# Patient Record
Sex: Female | Born: 1945 | Race: Black or African American | Hispanic: No | Marital: Single | Smoking: Never smoker
Health system: Southern US, Community
[De-identification: ages and names within clinical notes are randomized; demographics above are authoritative.]

## PROBLEM LIST (undated history)

## (undated) DIAGNOSIS — K769 Liver disease, unspecified: Secondary | ICD-10-CM

## (undated) DIAGNOSIS — R413 Other amnesia: Secondary | ICD-10-CM

## (undated) DIAGNOSIS — E041 Nontoxic single thyroid nodule: Secondary | ICD-10-CM

## (undated) DIAGNOSIS — K589 Irritable bowel syndrome without diarrhea: Secondary | ICD-10-CM

## (undated) DIAGNOSIS — I10 Essential (primary) hypertension: Secondary | ICD-10-CM

## (undated) DIAGNOSIS — G912 (Idiopathic) normal pressure hydrocephalus: Secondary | ICD-10-CM

## (undated) HISTORY — DX: Irritable bowel syndrome, unspecified: K58.9

## (undated) HISTORY — PX: PARTIAL HYSTERECTOMY: SHX80

## (undated) HISTORY — PX: OTHER SURGICAL HISTORY: SHX169

## (undated) HISTORY — DX: Liver disease, unspecified: K76.9

## (undated) HISTORY — DX: Other amnesia: R41.3

---

## 1975-08-22 DIAGNOSIS — D493 Neoplasm of unspecified behavior of breast: Secondary | ICD-10-CM | POA: Insufficient documentation

## 1998-05-31 ENCOUNTER — Ambulatory Visit (HOSPITAL_COMMUNITY): Admission: RE | Admit: 1998-05-31 | Discharge: 1998-05-31 | Payer: Self-pay | Admitting: *Deleted

## 1998-05-31 ENCOUNTER — Encounter: Payer: Self-pay | Admitting: *Deleted

## 2001-07-31 ENCOUNTER — Emergency Department (HOSPITAL_COMMUNITY): Admission: EM | Admit: 2001-07-31 | Discharge: 2001-07-31 | Payer: Self-pay

## 2001-08-28 ENCOUNTER — Ambulatory Visit (HOSPITAL_COMMUNITY): Admission: RE | Admit: 2001-08-28 | Discharge: 2001-08-28 | Payer: Self-pay | Admitting: Family Medicine

## 2003-07-01 ENCOUNTER — Encounter: Admission: RE | Admit: 2003-07-01 | Discharge: 2003-07-01 | Payer: Self-pay | Admitting: Emergency Medicine

## 2004-07-20 ENCOUNTER — Ambulatory Visit (HOSPITAL_COMMUNITY): Admission: RE | Admit: 2004-07-20 | Discharge: 2004-07-20 | Payer: Self-pay | Admitting: Emergency Medicine

## 2005-09-14 ENCOUNTER — Ambulatory Visit (HOSPITAL_COMMUNITY): Admission: RE | Admit: 2005-09-14 | Discharge: 2005-09-14 | Payer: Self-pay | Admitting: Emergency Medicine

## 2006-09-20 ENCOUNTER — Ambulatory Visit (HOSPITAL_COMMUNITY): Admission: RE | Admit: 2006-09-20 | Discharge: 2006-09-20 | Payer: Self-pay | Admitting: Emergency Medicine

## 2006-12-10 ENCOUNTER — Ambulatory Visit: Payer: Self-pay | Admitting: Internal Medicine

## 2006-12-10 ENCOUNTER — Encounter (INDEPENDENT_AMBULATORY_CARE_PROVIDER_SITE_OTHER): Payer: Self-pay | Admitting: Internal Medicine

## 2006-12-10 DIAGNOSIS — E7800 Pure hypercholesterolemia, unspecified: Secondary | ICD-10-CM

## 2006-12-10 LAB — CONVERTED CEMR LAB
Cholesterol: 200 mg/dL
Creatinine, Ser: 0.86 mg/dL
Platelets: 418 10*3/uL
Triglycerides: 136 mg/dL

## 2007-03-13 ENCOUNTER — Encounter (INDEPENDENT_AMBULATORY_CARE_PROVIDER_SITE_OTHER): Payer: Self-pay | Admitting: Internal Medicine

## 2007-03-13 DIAGNOSIS — K5730 Diverticulosis of large intestine without perforation or abscess without bleeding: Secondary | ICD-10-CM | POA: Insufficient documentation

## 2007-03-13 DIAGNOSIS — K648 Other hemorrhoids: Secondary | ICD-10-CM | POA: Insufficient documentation

## 2007-03-26 ENCOUNTER — Encounter (INDEPENDENT_AMBULATORY_CARE_PROVIDER_SITE_OTHER): Payer: Self-pay | Admitting: Internal Medicine

## 2007-03-26 DIAGNOSIS — I10 Essential (primary) hypertension: Secondary | ICD-10-CM

## 2007-03-26 DIAGNOSIS — R002 Palpitations: Secondary | ICD-10-CM | POA: Insufficient documentation

## 2007-03-26 DIAGNOSIS — F411 Generalized anxiety disorder: Secondary | ICD-10-CM | POA: Insufficient documentation

## 2007-08-27 ENCOUNTER — Telehealth (INDEPENDENT_AMBULATORY_CARE_PROVIDER_SITE_OTHER): Payer: Self-pay | Admitting: *Deleted

## 2007-08-27 ENCOUNTER — Encounter: Payer: Self-pay | Admitting: Internal Medicine

## 2007-10-31 ENCOUNTER — Ambulatory Visit (HOSPITAL_COMMUNITY): Admission: RE | Admit: 2007-10-31 | Discharge: 2007-10-31 | Payer: Self-pay | Admitting: Family Medicine

## 2007-11-07 ENCOUNTER — Encounter (INDEPENDENT_AMBULATORY_CARE_PROVIDER_SITE_OTHER): Payer: Self-pay | Admitting: Internal Medicine

## 2008-01-28 ENCOUNTER — Ambulatory Visit: Payer: Self-pay | Admitting: Internal Medicine

## 2008-01-28 DIAGNOSIS — H6120 Impacted cerumen, unspecified ear: Secondary | ICD-10-CM

## 2008-03-30 ENCOUNTER — Ambulatory Visit: Payer: Self-pay | Admitting: Family Medicine

## 2008-04-13 ENCOUNTER — Telehealth (INDEPENDENT_AMBULATORY_CARE_PROVIDER_SITE_OTHER): Payer: Self-pay | Admitting: Internal Medicine

## 2008-04-13 LAB — CONVERTED CEMR LAB
ALT: 17 units/L (ref 0–35)
Albumin: 4.6 g/dL (ref 3.5–5.2)
CO2: 24 meq/L (ref 19–32)
Calcium: 10 mg/dL (ref 8.4–10.5)
Chloride: 107 meq/L (ref 96–112)
Cholesterol: 221 mg/dL — ABNORMAL HIGH (ref 0–200)
Eosinophils Relative: 3 % (ref 0–5)
Glucose, Bld: 134 mg/dL — ABNORMAL HIGH (ref 70–99)
HCT: 40.5 % (ref 36.0–46.0)
Hemoglobin: 13 g/dL (ref 12.0–15.0)
Lymphocytes Relative: 40 % (ref 12–46)
Lymphs Abs: 2.9 10*3/uL (ref 0.7–4.0)
Monocytes Absolute: 0.4 10*3/uL (ref 0.1–1.0)
Neutro Abs: 3.8 10*3/uL (ref 1.7–7.7)
Platelets: 342 10*3/uL (ref 150–400)
Sodium: 144 meq/L (ref 135–145)
Total Bilirubin: 0.3 mg/dL (ref 0.3–1.2)
Total Protein: 7.4 g/dL (ref 6.0–8.3)
Triglycerides: 126 mg/dL (ref <150)
VLDL: 25 mg/dL (ref 0–40)
WBC: 7.3 10*3/uL (ref 4.0–10.5)

## 2008-05-05 ENCOUNTER — Encounter (INDEPENDENT_AMBULATORY_CARE_PROVIDER_SITE_OTHER): Payer: Self-pay | Admitting: *Deleted

## 2008-06-04 DIAGNOSIS — H269 Unspecified cataract: Secondary | ICD-10-CM | POA: Insufficient documentation

## 2008-06-08 ENCOUNTER — Encounter (INDEPENDENT_AMBULATORY_CARE_PROVIDER_SITE_OTHER): Payer: Self-pay | Admitting: Internal Medicine

## 2008-11-27 ENCOUNTER — Encounter (INDEPENDENT_AMBULATORY_CARE_PROVIDER_SITE_OTHER): Payer: Self-pay | Admitting: Internal Medicine

## 2008-11-27 ENCOUNTER — Ambulatory Visit (HOSPITAL_COMMUNITY): Admission: RE | Admit: 2008-11-27 | Discharge: 2008-11-27 | Payer: Self-pay | Admitting: Family Medicine

## 2008-11-27 ENCOUNTER — Encounter (INDEPENDENT_AMBULATORY_CARE_PROVIDER_SITE_OTHER): Payer: Self-pay | Admitting: Nurse Practitioner

## 2009-01-13 ENCOUNTER — Encounter (INDEPENDENT_AMBULATORY_CARE_PROVIDER_SITE_OTHER): Payer: Self-pay | Admitting: Internal Medicine

## 2009-11-02 ENCOUNTER — Telehealth (INDEPENDENT_AMBULATORY_CARE_PROVIDER_SITE_OTHER): Payer: Self-pay | Admitting: Internal Medicine

## 2009-11-18 ENCOUNTER — Ambulatory Visit: Payer: Self-pay | Admitting: Internal Medicine

## 2009-11-18 LAB — CONVERTED CEMR LAB
Albumin: 4.4 g/dL (ref 3.5–5.2)
Alkaline Phosphatase: 77 units/L (ref 39–117)
BUN: 13 mg/dL (ref 6–23)
Basophils Absolute: 0 10*3/uL (ref 0.0–0.1)
Basophils Relative: 0 % (ref 0–1)
Bilirubin Urine: NEGATIVE
Blood in Urine, dipstick: NEGATIVE
CO2: 27 meq/L (ref 19–32)
Cholesterol: 220 mg/dL — ABNORMAL HIGH (ref 0–200)
Eosinophils Relative: 3 % (ref 0–5)
Glucose, Bld: 81 mg/dL (ref 70–99)
Glucose, Urine, Semiquant: NEGATIVE
HCT: 42.5 % (ref 36.0–46.0)
HDL: 55 mg/dL (ref >39)
Hemoglobin: 12.9 g/dL (ref 12.0–15.0)
Ketones, urine, test strip: NEGATIVE
MCHC: 30.4 g/dL (ref 30.0–36.0)
MCV: 81.9 fL (ref 78.0–100.0)
Monocytes Absolute: 0.4 10*3/uL (ref 0.1–1.0)
Monocytes Relative: 5 % (ref 3–12)
Neutro Abs: 3.7 10*3/uL (ref 1.7–7.7)
RBC: 5.19 M/uL — ABNORMAL HIGH (ref 3.87–5.11)
RDW: 14.4 % (ref 11.5–15.5)
Sodium: 144 meq/L (ref 135–145)
Total Bilirubin: 0.4 mg/dL (ref 0.3–1.2)
Total Protein: 7.2 g/dL (ref 6.0–8.3)
Triglycerides: 124 mg/dL (ref <150)
Urobilinogen, UA: 0.2
VLDL: 25 mg/dL (ref 0–40)

## 2009-11-29 ENCOUNTER — Ambulatory Visit (HOSPITAL_COMMUNITY): Admission: RE | Admit: 2009-11-29 | Discharge: 2009-11-29 | Payer: Self-pay | Admitting: Emergency Medicine

## 2009-11-30 ENCOUNTER — Encounter (INDEPENDENT_AMBULATORY_CARE_PROVIDER_SITE_OTHER): Payer: Self-pay | Admitting: Internal Medicine

## 2010-04-27 ENCOUNTER — Ambulatory Visit: Payer: Self-pay | Admitting: Internal Medicine

## 2010-04-27 DIAGNOSIS — F068 Other specified mental disorders due to known physiological condition: Secondary | ICD-10-CM

## 2010-04-27 LAB — CONVERTED CEMR LAB
ALT: 15 units/L (ref 0–35)
AST: 17 units/L (ref 0–37)
Alkaline Phosphatase: 81 units/L (ref 39–117)
Basophils Absolute: 0 10*3/uL (ref 0.0–0.1)
Basophils Relative: 1 % (ref 0–1)
Creatinine, Ser: 0.76 mg/dL (ref 0.40–1.20)
Eosinophils Relative: 2 % (ref 0–5)
HCT: 43.1 % (ref 36.0–46.0)
Hemoglobin: 13.2 g/dL (ref 12.0–15.0)
MCHC: 30.6 g/dL (ref 30.0–36.0)
Monocytes Absolute: 0.4 10*3/uL (ref 0.1–1.0)
Platelets: 350 10*3/uL (ref 150–400)
RDW: 14.2 % (ref 11.5–15.5)
Sodium: 142 meq/L (ref 135–145)
TSH: 1.076 microintl units/mL (ref 0.350–4.500)
Total Bilirubin: 0.3 mg/dL (ref 0.3–1.2)

## 2010-04-29 ENCOUNTER — Encounter (INDEPENDENT_AMBULATORY_CARE_PROVIDER_SITE_OTHER): Payer: Self-pay | Admitting: Internal Medicine

## 2010-05-09 ENCOUNTER — Telehealth (INDEPENDENT_AMBULATORY_CARE_PROVIDER_SITE_OTHER): Payer: Self-pay | Admitting: Internal Medicine

## 2010-05-11 ENCOUNTER — Ambulatory Visit: Payer: Self-pay | Admitting: Internal Medicine

## 2010-05-11 LAB — CONVERTED CEMR LAB
BUN: 16 mg/dL (ref 6–23)
CO2: 26 meq/L (ref 19–32)
Chloride: 102 meq/L (ref 96–112)
Glucose, Bld: 99 mg/dL (ref 70–99)
Potassium: 4 meq/L (ref 3.5–5.3)

## 2010-05-13 ENCOUNTER — Encounter (INDEPENDENT_AMBULATORY_CARE_PROVIDER_SITE_OTHER): Payer: Self-pay | Admitting: Internal Medicine

## 2010-06-28 ENCOUNTER — Ambulatory Visit: Payer: Self-pay | Admitting: Internal Medicine

## 2010-06-28 DIAGNOSIS — R109 Unspecified abdominal pain: Secondary | ICD-10-CM | POA: Insufficient documentation

## 2010-07-04 ENCOUNTER — Telehealth (INDEPENDENT_AMBULATORY_CARE_PROVIDER_SITE_OTHER): Payer: Self-pay | Admitting: Internal Medicine

## 2010-07-04 ENCOUNTER — Encounter (INDEPENDENT_AMBULATORY_CARE_PROVIDER_SITE_OTHER): Payer: Self-pay | Admitting: Internal Medicine

## 2010-07-08 ENCOUNTER — Encounter (INDEPENDENT_AMBULATORY_CARE_PROVIDER_SITE_OTHER): Payer: Self-pay | Admitting: *Deleted

## 2010-07-15 ENCOUNTER — Ambulatory Visit (HOSPITAL_COMMUNITY)
Admission: RE | Admit: 2010-07-15 | Discharge: 2010-07-15 | Payer: Self-pay | Source: Home / Self Care | Admitting: Internal Medicine

## 2010-07-18 ENCOUNTER — Encounter (INDEPENDENT_AMBULATORY_CARE_PROVIDER_SITE_OTHER): Payer: Self-pay | Admitting: Internal Medicine

## 2010-07-20 ENCOUNTER — Encounter (INDEPENDENT_AMBULATORY_CARE_PROVIDER_SITE_OTHER): Payer: Self-pay | Admitting: Internal Medicine

## 2010-07-20 ENCOUNTER — Telehealth (INDEPENDENT_AMBULATORY_CARE_PROVIDER_SITE_OTHER): Payer: Self-pay | Admitting: Internal Medicine

## 2010-07-26 ENCOUNTER — Encounter (INDEPENDENT_AMBULATORY_CARE_PROVIDER_SITE_OTHER): Payer: Self-pay | Admitting: *Deleted

## 2010-07-26 ENCOUNTER — Encounter
Admission: RE | Admit: 2010-07-26 | Discharge: 2010-09-20 | Payer: Self-pay | Source: Home / Self Care | Attending: Internal Medicine | Admitting: Internal Medicine

## 2010-09-02 ENCOUNTER — Telehealth (INDEPENDENT_AMBULATORY_CARE_PROVIDER_SITE_OTHER): Payer: Self-pay | Admitting: *Deleted

## 2010-09-06 ENCOUNTER — Ambulatory Visit: Admit: 2010-09-06 | Payer: Self-pay | Admitting: Gastroenterology

## 2010-09-10 ENCOUNTER — Encounter: Payer: Self-pay | Admitting: Emergency Medicine

## 2010-09-11 ENCOUNTER — Encounter: Payer: Self-pay | Admitting: Internal Medicine

## 2010-09-22 NOTE — Letter (Signed)
Summary: New Patient letter  [facility]  [address] [place], [place] [address]   Phone: [phone]  Fax: [phone]       07/26/2010 MRN: [id number]  [facility] [address] [place], [place]  [address]  Dear Emily Brown,  Welcome to the Gastroenterology Division at [facility].    You are scheduled to see Dr.  Christella Hartigan on 09-06-10 at 1:30p.m. on the 3rd floor at [facility], [address]  We ask that you try to arrive at our office 15 minutes prior to your appointment time to allow for check-in.  We would like you to complete the enclosed self-administered evaluation form prior to your visit and bring it with you on the day of your appointment.  We will review it with you.  Also, please bring a complete list of all your medications or, if you prefer, bring the medication bottles and we will list them.  Please bring your insurance card so that we may make a copy of it.  If your insurance requires a referral to see a specialist, please bring your referral form from your primary care physician.  Co-payments are due at the time of your visit and may be paid by cash, check or credit card.     Your office visit will consist of a consult with your physician (includes a physical exam), any laboratory testing he/she may order, scheduling of any necessary diagnostic testing (e.g. x-ray, ultrasound, CT-scan), and scheduling of a procedure (e.g. Endoscopy, Colonoscopy) if required.  Please allow enough time on your schedule to allow for any/all of these possibilities.    If you cannot keep your appointment, please call [phone] to cancel or reschedule prior to your appointment date.  This allows [place] the opportunity to schedule an appointment for another patient in need of care.  If you do not cancel or reschedule by 5 p.m. the business day prior to your appointment date, you will be charged a $50.00 late cancellation/no-show fee.    Thank you for  choosing [facility] for your medical needs.  We appreciate the opportunity to care for you.  Please visit [place] at our website  to learn more about our practice.                     Sincerely,                                                             The Gastroenterology Division

## 2010-09-22 NOTE — Assessment & Plan Note (Signed)
Summary: 2 MONTH FU ON MEMORY LOSS//KT   Vital Signs:  Patient profile:   65 year old female Height:      65.5 inches Weight:      167 pounds BMI:     27.47 Temp:     98.0 degrees F oral Pulse rate:   78 / minute Pulse rhythm:   regular Resp:     18 per minute BP sitting:   130 / 86  (left arm) Cuff size:   regular  Vitals Entered By: [place] shannon CC: f/u on memory loss Is Patient Diabetic? No Pain Assessment Patient in pain? no       Does patient need assistance? Functional Status Self care Ambulation Normal   CC:  f/u on memory loss.  History of Present Illness: 1.  Mild dementia:  Unable to get CT of brain with diagnosis of memory loss.  Pt. states she did not start Aricept.  Afraid of side effects.  Sister states they are working on mental exercises--learning new things.  Sounds like family checking in with Ms. Wagster regularly to make sure keeping her grandchildren's appts.  The group workening with children apparently no longer involved.  Not clear if this is secondary to their satisfaction that support network established and appts for children are being kepr or another reason.  Pt gives me her okay to call and speak with them.  Sister would like B12 level checked--discussed her previous blood work does not really support deficiency of this as a likely problem.    2.  Nausea and vomiting:  Intermittent for years--at least 10.  Maybe one episode in every 2 months.  Starts in bilateral lower abdomen to mid abdomen, cramping in nature.  Usually has in morning when first awakens.  Stools are loose with a mixture of liquid and solid.  Has multple stools.  No change in color from usual of brown.  No melena or hematochezia associated.  Has on rare occasion, small amt of bright red blood in stool --mixed with stool.  No blood on tissue.  Has not had blood in stool for a year or more.  This latter sign has not happened for some time.  Can last 3-4 hours and if does not eat,  gradually dissipates.  If eats, will maintain the cramping and diarrhea.  Rarely, like this morning, has vomiting associated.  Had not eaten anything this morning.  Has not had a colonoscopy, though brother died at 27 from colon cancer.  Denies use of NSAIDS.  Cannot recall eating anything in past that consistently might cause this.  No heartburn.  No brash taste in mouth in mornings.  3.  Hypertension:  Better control with  HCTZ.  Allergies (verified): 1)  Amoxicillin 2)  Septra  Physical Exam  General:  NAD Lungs:  Normal respiratory effort, chest expands symmetrically. Lungs are clear to auscultation, no crackles or wheezes. Heart:  Normal rate and regular rhythm. S1 and S2 normal without gallop, murmur, click, rub or other extra sounds.  Radial pulses normal and equal. Abdomen:  Bowel sounds positive,abdomen soft and non-tender without masses, organomegaly or hernias noted.   Impression & Recommendations:  Problem # 1:  DEMENTIA, MILD (ICD-294.8)  Discussed understanding of side effect concerns, but encouraged pt. to try medication and just stop if side effects. Discussed would not be able to recover lost memory over time she is not taking medication. Will see about getting the CT scan of brain again. Check B12 level  Orders: CT without Contrast (CT w/o contrast)  Problem # 2:  ABDOMINAL CRAMPS (ICD-789.00) Sounds like a poor diet--am concerned for her diet as well as her grandchildren in her care. May be cause of most of her symptoms, though with family history of colon cancer, needs at least a colonoscopy for screening purposes Orders: Gastroenterology Referral (GI) Nutrition Referral (Nutrition)  Problem # 3:  HYPERTENSION, BENIGN ESSENTIAL (ICD-401.1) Better control Her updated medication list for this problem includes:    Hydrochlorothiazide 12.5 Mg Caps (Hydrochlorothiazide) .Marland Kitchen... 1 cap by mouth daily  Complete Medication List: 1)  Hydrochlorothiazide 12.5 Mg Caps  (Hydrochlorothiazide) .Marland Kitchen.. 1 cap by mouth daily 2)  Aricept 5 Mg Tabs (Donepezil hcl) .Marland Kitchen.. 1 tab by mouth daily  Other Orders: T-Vitamin B12 ([address]454) Flu Vaccine 2yrs + (09811) Admin 1st Vaccine (91478)  Patient Instructions: 1)  Follow up with Dr. Delrae Alfred in 3 months --dementia 2)  Call if you get [place] [facility] and I will get [facility] involved with you 3)  Call if you do not hear about gastroenterology or nutrition Prescriptions: HYDROCHLOROTHIAZIDE 12.5 MG CAPS (HYDROCHLOROTHIAZIDE) 1 cap by mouth daily  #30 x 4   Entered and Authorized by:   Julieanne Manson MD   Signed by:   Julieanne Manson MD on 06/28/2010   Method used:   Electronically to        [facility] (retail)       [address]       [place], [place]  [id number]       Ph: [phone]       Fax: [phone]   RxID:   [id number] ARICEPT 5 MG TABS (DONEPEZIL HCL) 1 tab by mouth daily  #30 x 4   Entered and Authorized by:   Julieanne Manson MD   Signed by:   Julieanne Manson MD on 06/28/2010   Method used:   Electronically to        [facility] (retail)       [address]       [place], [place]  [id number]       Ph: [phone]       Fax: [phone]   RxID:   [id number]    Orders Added: 1)  T-Vitamin B12 [82607-23330] 2)  Est. Patient Level IV [42706] 3)  Gastroenterology Referral [GI] 4)  Nutrition Referral [Nutrition] 5)  Flu Vaccine 86yrs + [90658] 6)  Admin 1st Vaccine [90471] 7)  CT without Contrast [CT w/o contrast]   Immunizations Administered:  Influenza Vaccine # 1:    Vaccine Type: Fluvax 3+    Site: right deltoid    Mfr: GlaxoSmithKline    Dose: 0.5 ml    Route: IM    Given by: Gaylyn Cheers RN    Exp. Date: 02/18/2011    Lot #: [id number]    VIS given: 03/15/10 version given July 05, 2010.  Flu Vaccine Consent Questions:    Do you have a history of severe allergic reactions to this vaccine? no    Any  prior history of allergic reactions to egg and/or gelatin? no    Do you have a sensitivity to the preservative Thimersol? no    Do you have a past history of Guillan-Barre Syndrome? no    Do you currently have an acute febrile illness? no    Have you ever had a severe reaction to latex? no    Vaccine information given and explained to patient? yes  Are you currently pregnant? no   Immunizations Administered:  Influenza Vaccine # 1:    Vaccine Type: Fluvax 3+    Site: right deltoid    Mfr: GlaxoSmithKline    Dose: 0.5 ml    Route: IM    Given by: Gaylyn Cheers RN    Exp. Date: 02/18/2011    Lot #: [id number]    VIS given: 03/15/10 version given July 05, 2010.

## 2010-09-22 NOTE — Assessment & Plan Note (Signed)
Summary: no ov since 8/09.  Wants referral /tmm   Vital Signs:  Patient profile:   65 year old female Height:      65.5 inches Weight:      169.31 pounds BMI:     27.85 Temp:     97.8 degrees F Pulse rate:   80 / minute Pulse rhythm:   regular Resp:     18 per minute BP sitting:   138 / 83  (left arm) Cuff size:   regular  Vitals Entered By: Chauncy Passy, SMA CC: Pt. is here and would like a referral to [facility]. for a mammogram. Pt. does not recall her last mammogram. She has an appt 4/11 for a mammogram.  Pt. has not been here since. 8/09.  Is Patient Diabetic? No Pain Assessment Patient in pain? no       Does patient need assistance? Functional Status Self care Ambulation Normal   CC:  Pt. is here and would like a referral to [facility]. for a mammogram. Pt. does not recall her last mammogram. She has an appt 4/11 for a mammogram.  Pt. has not been here since. 8/09. Marland Kitchen  History of Present Illness: 1.  Health Maintenance:  Had colonoscopy 03/18/07.  Due in 19-Jan-2012 (5 years)  [facility]--I believe Dr. Randa Evens.  Brother with history of colon cancer.  Mammogram scheduled for beginning of April.    2.  Hypercholesterolemia:  Had some Bobby Rumpf this a.m.--6 oz.  Nothing else.  3.  Hypertension:  Has been on meds in past.  Does a lot of walking currently.  Careful with sodium intake.  Meds:  currently none.  Habits & Providers  Alcohol-Tobacco-Diet     Alcohol drinks/day: 0     Tobacco Status: never  Exercise-Depression-Behavior     Drug Use: never  Current Medications (verified): 1)  None  Allergies (verified): 1)  Amoxicillin 2)  Septra  Family History: Mother:  39:  kidney stones,glaucoma. Father: died Sep 19, 2022 age 7:  complications of DM-kidney failure,HTN,high cholesterol Brother:   died age 84: Jan 18, 2006 colon cancer 4 more brothers:  healthy 6 sisters:  Youngest with unknown kidney disease--long hospitalization, rest healthy No biologic children.  Social  History: Occupation:unemployed. Previously in daycare Single.  Lives at home with 2 adopted sons:  ages 40 and 86 Not sexually active.  Never Smoked Alcohol use-no Drug use-no Drug Use:  never  Physical Exam  General:  overweight,in no acute distress; alert,appropriate and cooperative throughout examination Lungs:  Normal respiratory effort, chest expands symmetrically. Lungs are clear to auscultation, no crackles or wheezes. Heart:  Normal rate and regular rhythm. S1 and S2 normal without gallop, murmur, click, rub or other extra sounds.  Radial pulses normal and equal.  No carotid  bruits, normodynamic carotid pulses bilaterally Abdomen:  Bowel sounds positive,abdomen soft and non-tender without masses, organomegaly or hernias noted. Extremities:  No edema Neurologic:  alert & oriented X3 and cranial nerves II-XII intact.   Cervical Nodes:  No lymphadenopathy noted   Impression & Recommendations:  Problem # 1:  NEOPLASM, MALIGNANT, COLON, FAMILY HX, BROTHER (ICD-V16.0) Hemoccult cards x3 to return in 2 weeks Orders: T-CBC w/Diff ([id number])  Problem # 2:  EXAMINATION, ROUTINE MEDICAL (ICD-V70.0) Mammogram scheduled  Problem # 3:  HYPERCHOLESTEROLEMIA, MILD (ICD-272.0)  Orders: T-Lipid Profile ([address]11-91478)  Problem # 4:  HYPERTENSION, BENIGN ESSENTIAL (ICD-401.1) BP has been fine without meds--encouraged weight loss --goal of around 152--with exercise and dietary changes. Orders: T-Comprehensive Metabolic  Panel [phone]) UA Dipstick w/o Micro (manual) (09811)   Tetanus/Td Immunization History:    Tetanus/Td # 1:  Tdap (03/30/2008)  Laboratory Results   Urine Tests    Routine Urinalysis   Glucose: negative   (Normal Range: Negative) Bilirubin: negative   (Normal Range: Negative) Ketone: negative   (Normal Range: Negative) Spec. Gravity: >=1.030   (Normal Range: 1.003-1.035) Blood: negative   (Normal Range: Negative) pH: 5.5   (Normal Range:  5.0-8.0) Protein: trace   (Normal Range: Negative) Urobilinogen: 0.2   (Normal Range: 0-1) Nitrite: negative   (Normal Range: Negative) Leukocyte Esterace: negative   (Normal Range: Negative)

## 2010-09-22 NOTE — Letter (Signed)
Summary: *Referral Letter  [facility]  [address] [place], [place] [address]   Phone: [phone]  Fax: [phone]    07/04/2010  Thank you in advance for agreeing to see my patient:  [facility] [address] [place], [place]  [address]  Phone: [phone]  Reason for Referral:   Long time history of lower abdominal cramping followed by loose stools.  Generally just in mornings.  No recent hematochezia or melena.  Hx of  brother with colon cancer.  Suspect GI symptoms related to poor diet or IBS with chronicity.  Procedures Requested: Colonoscopy--more for screening purposes.    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) 5)  CATARACTS, BILATERAL (ICD-366.9) 6)  OTHER SCREENING BREAST EXAMINATION (ICD-V76.19) 7)  EXAMINATION, ROUTINE MEDICAL (ICD-V70.0) 8)  CERUMEN IMPACTION, RIGHT (ICD-380.4) 9)  HYPERCHOLESTEROLEMIA, MILD (ICD-272.0) 10)  HEMORRHOIDS, INTERNAL (ICD-455.0) 11)  DIVERTICULOSIS, COLON W/O HEM (ICD-562.10) 12)  NEOP, NOS, BREAST (ICD-239.3) 13)  PALPITATIONS (ICD-785.1) 14)  ANXIETY STATE NOS (ICD-300.00) 15)  HYPERTENSION, BENIGN ESSENTIAL (ICD-401.1)   Current Medications: 1)  HYDROCHLOROTHIAZIDE 12.5 MG CAPS (HYDROCHLOROTHIAZIDE) 1 cap by mouth daily 2)  ARICEPT 5 MG TABS (DONEPEZIL HCL) 1 tab by mouth daily   Past Medical History: 1)  HYPERCHOLESTEROLEMIA, MILD (ICD-272.0) 2)  HEMORRHOIDS, INTERNAL (ICD-455.0) 3)  DIVERTICULOSIS, COLON W/O HEM (ICD-562.10) 4)  NEOP, NOS, BREAST (ICD-239.3) 5)  PALPITATIONS (ICD-785.1) 6)  ANXIETY STATE NOS (ICD-300.00) 7)  HYPERTENSION, BENIGN ESSENTIAL (ICD-401.1)   Prior History of Blood Transfusions:   Pertinent Labs:    Thank you again for agreeing to see our patient; please contact us if  you have any further questions or need additional information.  Sincerely,  Julieanne Manson MD

## 2010-09-22 NOTE — Letter (Signed)
Summary: *Referral Letter  [facility]  [address] [place], [place] [address]   Phone: [phone]  Fax: [phone]    07/20/2010  Thank you in advance for agreeing to see my patient:  [facility] [address] [place], [place]  [address]  Phone: [phone]  Reason for Referral: Mild dementia on MMSE (26 of 30 score) with abnormal CT of brain recently showing enlarged ventricles.  Pt. and sister deny any gait abnormalities or urinary incontinence.  Memory issues came to light as pt. caring for 2 young children and was missing appts. for their care.  Pt. and family have noted memory issues possibly since 1s, but sister states more profound in last 2 years.  Sister recently related that pt. fell and hit head 25 years ago at work.    Procedures Requested: Evaluation for possible NPH and treatment.  Pt. was already initiated on Aricept.    Current Medical Problems: 1)  ? 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) 6)  CATARACTS, BILATERAL (ICD-366.9) 7)  OTHER SCREENING BREAST EXAMINATION (ICD-V76.19) 8)  EXAMINATION, ROUTINE MEDICAL (ICD-V70.0) 9)  CERUMEN IMPACTION, RIGHT (ICD-380.4) 10)  HYPERCHOLESTEROLEMIA, MILD (ICD-272.0) 11)  HEMORRHOIDS, INTERNAL (ICD-455.0) 12)  DIVERTICULOSIS, COLON W/O HEM (ICD-562.10) 13)  NEOP, NOS, BREAST (ICD-239.3) 14)  PALPITATIONS (ICD-785.1) 15)  ANXIETY STATE NOS (ICD-300.00) 16)  HYPERTENSION, BENIGN ESSENTIAL (ICD-401.1)   Current Medications: 1)  HYDROCHLOROTHIAZIDE 12.5 MG CAPS (HYDROCHLOROTHIAZIDE) 1 cap by mouth daily 2)  ARICEPT 5 MG TABS (DONEPEZIL HCL) 1 tab by mouth daily   Past Medical History: 1)  HYPERCHOLESTEROLEMIA, MILD (ICD-272.0) 2)  HEMORRHOIDS, INTERNAL (ICD-455.0) 3)   DIVERTICULOSIS, COLON W/O HEM (ICD-562.10) 4)  NEOP, NOS, BREAST (ICD-239.3) 5)  PALPITATIONS (ICD-785.1) 6)  ANXIETY STATE NOS (ICD-300.00) 7)  HYPERTENSION, BENIGN ESSENTIAL (ICD-401.1)    Pertinent Labs:  Recent CBC, CMET, VB12, RPR normal   Thank you again for agreeing to see our patient; please contact us if you have any further questions or need additional information.  Sincerely,  Julieanne Manson MD

## 2010-09-22 NOTE — Letter (Signed)
Summary: Lipid Letter  [facility]  [address] [place], [place] [address]   Phone: [phone]  Fax: [phone]    11/30/2009  [facility] [address] [place], [place]  [address]  Dear Ms. Nest:  We have carefully reviewed your last lipid profile from 11/18/2009 and the results are noted below with a summary of recommendations for lipid management.    Cholesterol:       220 high     Goal: <200   HDL "good" Cholesterol:   55       Goal: >45   LDL "bad" Cholesterol:   140 high     Goal: <130   Triglycerides:       124       Goal: <150    Cholesterol is still about the same with mildly high total and bad cholesterol.  Would continue to work on diet and exercise to get these to goals listed above.  Rest of labs were fine.  Check cholesterol yearly.    TLC Diet (Therapeutic Lifestyle Change): Saturated Fats & Transfatty acids should be kept < 7% of total calories ***Reduce Saturated Fats Polyunstaurated Fat can be up to 10% of total calories Monounsaturated Fat Fat can be up to 20% of total calories Total Fat should be no greater than 25-35% of total calories Carbohydrates should be 50-60% of total calories Protein should be approximately 15% of total calories Fiber should be at least 20-30 grams a day ***Increased fiber may help lower LDL Total Cholesterol should be < 200mg /day Consider adding plant stanol/sterols to diet (example: Benacol spread) ***A higher intake of unsaturated fat may reduce Triglycerides and Increase HDL    Adjunctive Measures (may lower LIPIDS and reduce risk of Heart Attack) include: Aerobic Exercise (20-30 minutes 3-4 times a week) Limit Alcohol Consumption Weight Reduction Aspirin 75-81 mg a day by mouth (if not allergic or contraindicated) Dietary Fiber 20-30 grams a day by mouth     Current Medications:  None If you have any questions, please call. We appreciate being able to work with you.   Sincerely,     [facility] Julieanne Manson MD

## 2010-09-22 NOTE — Letter (Signed)
Summary: *HSN Results Follow up  [facility]  [address] [place], [place] [address]   Phone: [phone]  Fax: [phone]      07/04/2010   Emily Brown [address] [place], [place]  [address]   Dear  [name],                            ____S.Drinkard,FNP   ____D. Gore,FNP       ____B. McPherson,MD   ____V. Rankins,MD    ___XX_E. Mulberry,MD    ____N. Daphine Deutscher, FNP  ____D. Reche Dixon, MD    ____K. Philipp Deputy, MD    ____Other     This letter is to inform you that your recent test(s):  _______Pap Smear    ___X____Lab Test     _______X-ray    ___X____ is within acceptable limits  _______ requires a medication change  _______ requires a follow-up lab visit  _______ requires a follow-up visit with your Arnoldo Hildreth   Comments:  B12 level was normal       _________________________________________________________ If you have any questions, please contact our office                     Sincerely,  Julieanne Manson MD [facility]

## 2010-09-22 NOTE — Letter (Signed)
Summary: *HSN Results Follow up  [facility]  [address] [place], [place] [address]   Phone: [phone]  Fax: [phone]      07/08/2010   CHIQUITTA MATTY Vanderbeck [address] [place], [place]  [address]   Dear  [name],                            ____S.Drinkard,FNP   ____D. Gore,FNP       ____B. McPherson,MD   ____V. Rankins,MD    __X__E. Mulberry,MD    ____N. Daphine Deutscher, FNP  ____D. Reche Dixon, MD    ____K. Philipp Deputy, MD    ____Other     This letter is to inform you that your recent test(s):  _______Pap Smear    _______Lab Test     _______X-ray    _______ is within acceptable limits  _______ requires a medication change  _______ requires a follow-up lab visit  _______ requires a follow-up visit with your provider   Comments:   I BEEN TRYING TO REACH AND THE NUTRIRIONIST CLINIC TOO .                     PLEASE CALL [phone] TO MAKE AN APPT WITH THE NUTRITIONIST                     THANK YOU          _________________________________________________________ If you have any questions, please contact our office                     Sincerely,  [name] [facility]

## 2010-09-22 NOTE — Progress Notes (Signed)
  Phone Note From Other Clinic   Summary of Call: FYI  MEDICAID DENIED CT OF HEAD FOR MEMORY LOSS Initial call taken by: Michelle Nasuti,  May 09, 2010 4:13 PM  Follow-up for Phone Call        okay Follow-up by: Julieanne Manson MD,  June 28, 2010 2:18 PM

## 2010-09-22 NOTE — Progress Notes (Signed)
Summary: cx appt  ---- Converted from flag ---- ---- 09/02/2010 3:05 PM, Rachael Fee MD wrote: she is a patient of Dr. Vilinda Boehringer (has had colonoscoyp/egd with him).  should follow up with him unless she wants to switch her care to me. ------------------------------  Appended Document: cx appt Pt agrees to see Dr Vilinda Boehringer I will call and route this message to  Dr Delrae Alfred and have them reschedule with him.  Appt has been cx in [facility].  Appended Document: cx appt Nora--see above. Ms.  Kobus is a pt. of Dr. Andrena Mews her GI referral should have been directed to him--that's my miss.  I did not see her scanned in colonoscopy from 2008--which was negative for concerning colon polyps. Please call the pt. and her sister and let them know she in actuality has had a colonoscopy in 2008 and will not need another until 2013.  The colonoscopy was done for family hx of colon cancer and diarrhea--the same reason we were sending her.  Please call Dr. Randa Evens' office and see if there was any follow up regarding the diarrhea.  I call [facility] pt don't have a f/u appt and also I call Ms Marcy and her sister wasn't there but i make sure she told her about her next colonoscopy.Marland KitchenMarland KitchenCheryll Dessert 09-05-10 @ 458pm    Clinical Lists Changes  Observations: Added new observation of COLONOSCOPY: Dr. Randa Evens:  Diverticulosis.  No polyps.  Done for diarrhea--biopsy normal.  Repeat in 5 years (03/13/2007 15:42)

## 2010-09-22 NOTE — Progress Notes (Signed)
  Phone Note Call from Patient Call back at [facility] Phone [phone] Call back at [phone]   Summary of Call: The pt already has an [facility] so she only needs the referral from the provider to be fax to [facility] MD Initial call taken by: Manon Hilding,  November 02, 2009 11:10 AM  Follow-up for Phone Call        No ov since 8/09.  Pt scheduled for 3/31 at 8:45am. Follow-up by: Vesta Mixer CMA,  November 03, 2009 9:43 AM

## 2010-09-22 NOTE — Assessment & Plan Note (Signed)
Summary: 2 WEEK FU BMET/AND BP CHECK//KT  Nurse Visit   Vital Signs:  Patient profile:   65 year old female Pulse rate:   84 / minute Pulse rhythm:   regular Resp:     20 per minute BP sitting:   140 / 86  (right arm) Cuff size:   regular  Vitals Entered By: Dutch Quint RN (May 11, 2010 2:08 PM)  Impression & Recommendations:  Problem # 1:  HYPERTENSION, BENIGN ESSENTIAL (ICD-401.1)  Her updated medication list for this problem includes:    Hydrochlorothiazide 12.5 Mg Caps (Hydrochlorothiazide) .Marland Kitchen... 1 cap by mouth daily  BP 140/86.  Better than last visit - 164/92 Continue medications, no change F/U appt. in November with provider  Orders: T-Basic Metabolic Panel [phone]) Est. Patient Level I (09811)  Complete Medication List: 1)  Hydrochlorothiazide 12.5 Mg Caps (Hydrochlorothiazide) .Marland Kitchen.. 1 cap by mouth daily 2)  Aricept 5 Mg Tabs (Donepezil hcl) .Marland Kitchen.. 1 tab by mouth daily   Patient Instructions: 1)  Reviewed with Wende Mott. 2)  Your blood pressure is better today -- 140/86. 3)  Continue medications ordered until you see Dr. Delrae Alfred. 4)  We will call you with the results of your labwork that was done today. 5)  Make sure you keep your appointment with Dr. Delrae Alfred in November. 6)  Call if anything changes or if you have any questions.   Physical Exam  General:  alert, well-developed, well-nourished, and well-hydrated.   Lungs:  normal respiratory effort and normal breath sounds.   Heart:  normal rate and regular rhythm.     CC:  f/u BP check and BMET.  History of Present Illness: In office 04/27/10, started on HCTZ, here for BP check.  Last BP 164/92.  States she's not taking the Aricept -- she's concerned about all of the side effects.   Review of Systems CV:  Denies chest pain or discomfort, difficulty breathing at night, difficulty breathing while lying down, fainting, fatigue, leg cramps with exertion, lightheadness, near fainting,  palpitations, shortness of breath with exertion, swelling of feet, and swelling of hands; Denies headache or cough.  CC: f/u BP check and BMET Is Patient Diabetic? No Pain Assessment Patient in pain? no       Does patient need assistance? Functional Status Self care Ambulation Normal   Allergies: 1)  Amoxicillin 2)  Septra  Orders Added: 1)  T-Basic Metabolic Panel [[id number]] 2)  Est. Patient Level I [91478]

## 2010-09-22 NOTE — Progress Notes (Signed)
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

## 2010-09-22 NOTE — Letter (Signed)
Summary: MAILED REQUESTED RECORDS TO [facility]  MAILED REQUESTED RECORDS TO [facility]   Imported By: Arta Bruce 04/29/2010 10:40:38  _____________________________________________________________________  External Attachment:    Type:   Image     Comment:   External Document

## 2010-09-22 NOTE — Letter (Signed)
Summary: [employer]//APPROVED  [employer]//APPROVED   Imported By: Arta Bruce 07/18/2010 16:12:40  _____________________________________________________________________  External Attachment:    Type:   Image     Comment:   External Document

## 2010-09-22 NOTE — Assessment & Plan Note (Signed)
Summary: SIGNS OF DEMENTIA///KT   Vital Signs:  Patient profile:   64 year old female Height:      65.5 inches Weight:      169 pounds Temp:     98.3 degrees F oral Pulse rate:   70 / minute Pulse rhythm:   regular Resp:     16 per minute BP sitting:   164 / 92  (left arm) Cuff size:   regular  Vitals Entered By: Michelle Nasuti (April 27, 2010 10:43 AM) CC: pt requests to be tested for dementia Pain Assessment Patient in pain? no       Does patient need assistance? Functional Status Self care Ambulation Normal   CC:  pt requests to be tested for dementia.  History of Present Illness: Pt. here with Wellington Hampshire, QP for [employer], [place].  who work with youth with mental heatlh issues.  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  They have noted concerns with her memory and wanted to make sure this was not a concern in the care of the boys.  These boys are her sister's daughter's (niece) children.  The children's mother has a drug use problem that she has not been able to overcome.  Ms. Scritchfield has had them all of their lives--right from the nursery.  [facility] [employer] has had difficulties with missed appts.  Instructions have often been forgotten, despite use of calendars and writing down dates and instructions.  The pt. has noted memory difficulties as well.  Pt. feels she has had problems for many years--possibly dating back to late 1990s.  [employer] has noted problems the entire year they have been following the boys.  Mr.  Lockie Pares states her memory lapses have stayed about the same throughout the past year.  Pt. has no history of a stroke.  Pt. denies any problem with urine incontinence.  No gait issues.  Pt. not certain if father had dementia--he died in 2022-09-16 at age 67.  Pt. has left stove on in home and forgotten about it a couple of times.  Burned a pot once with this.  Has  never gotten lost when left home.  Sister came to room subsequently and states the family has noted gradually worsening memory over past 2 years.    Current Medications (verified): 1)  None  Allergies (verified): 1)  Amoxicillin 2)  Septra  Physical Exam  General:  NAD Eyes:  No corneal or conjunctival inflammation noted. EOMI. Perrla. Funduscopic exam benign, without hemorrhages, exudates or papilledema. Vision grossly normal. Lungs:  Normal respiratory effort, chest expands symmetrically. Lungs are clear to auscultation, no crackles or wheezes. Heart:  Normal rate and regular rhythm. S1 and S2 normal without gallop, murmur, click, rub or other extra sounds.  Radial pulses normal and equal. Neurologic:  Alert and oriented X2--unable to give exact date.cranial nerves II-XII intact, strength normal in all extremities, gait normal, and DTRs symmetrical and normal.   MMSE scored 26 out of possible 30.  Could not give exact date--14th when it is the 7th.  Could not recall 3 objects/words.   Impression & Recommendations:  Problem # 1:  MEMORY LOSS (ICD-780.93) Evaluation for dementia--appears to be in mild range at this time. Mr. Lockie Pares and his company will work with pt's sister and family to come up with a support system that everyone feels comfortable will be safe for the  children and the patient. Evaluation of possible treatable causes on our end. I also called Nelly Laurence at [facility] to see if they could help with a pharmacy that can deliver meds in a weekly fashion and help out with any safety issues at home they find. Spent over an hour with patient, Dr. Lockie Pares and pt's sister today discussing and evaluating for memory loss. Orders: T-Comprehensive Metabolic Panel [id number]) T-CBC w/Diff [id number]) T-Syphilis Test (RPR) [id number]) T-TSH [phone]) CT with & without Contrast (CT w&w/o Contrast)  Problem # 2:  HYPERTENSION, BENIGN ESSENTIAL (ICD-401.1) BP up  today--start low dose HCTZ Her updated medication list for this problem includes:    Hydrochlorothiazide 12.5 Mg Caps (Hydrochlorothiazide) .Marland Kitchen... 1 cap by mouth daily  Complete Medication List: 1)  Hydrochlorothiazide 12.5 Mg Caps (Hydrochlorothiazide) .Marland Kitchen.. 1 cap by mouth daily 2)  Aricept 5 Mg Tabs (Donepezil hcl) .Marland Kitchen.. 1 tab by mouth daily  Patient Instructions: 1)  Follow up with Dr. Delrae Alfred in 2 months  --memory loss and hypertension 2)  BP check and BMET in 2 weeks with nurse--V58.69 for lab Prescriptions: ARICEPT 5 MG TABS (DONEPEZIL HCL) 1 tab by mouth daily  #30 x 4   Entered and Authorized by:   Julieanne Manson MD   Signed by:   Julieanne Manson MD on 04/27/2010   Method used:   Electronically to        [facility] [address] (retail)       [address]       [place], [place]  [id number]       Ph: [phone]       Fax: [phone]   RxID:   [id number] HYDROCHLOROTHIAZIDE 12.5 MG CAPS (HYDROCHLOROTHIAZIDE) 1 cap by mouth daily  #30 x 4   Entered and Authorized by:   Julieanne Manson MD   Signed by:   Julieanne Manson MD on 04/27/2010   Method used:   Electronically to        [facility] [address] [facility] (retail)       [address]       [place], [place]  [address]       Ph: [phone]       Fax: [phone]   RxID:   [id number]

## 2010-09-22 NOTE — Letter (Signed)
Summary: *HSN Results Follow up  [facility]  [address] [place], [place] [address]   Phone: [phone]  Fax: [phone]      05/13/2010   IOLA TURRI [place] [address] [place], [place]  [address]   Dear  [name],                            ____S.Drinkard,FNP   ____D. Gore,FNP       ____B. McPherson,MD   ____V. Rankins,MD    __X__E. Mulberry,MD    ____N. Daphine Deutscher, FNP  ____D. Reche Dixon, MD    ____K. Philipp Deputy, MD    ____Other     This letter is to inform you that your recent test(s):  _______Pap Smear    ___X____Lab Test     _______X-ray    ____X___ is within acceptable limits  _______ requires a medication change  _______ requires a follow-up lab visit  _______ requires a follow-up visit with your provider   Comments:  Lab tests at your visit and with your blood pressure recently were all normal.  I am trying to get Medicaid to cover your CT scan--in process.       _________________________________________________________ If you have any questions, please contact our office                     Sincerely,  Julieanne Manson MD [facility]

## 2010-09-22 NOTE — Progress Notes (Signed)
Summary: GI and Nutrition referral  Phone Note Outgoing Call   Summary of Call: Emily Brown--GI referral and Nutrition referral. Initial call taken by: Julieanne Manson MD,  July 04, 2010 10:25 AM  Follow-up for Phone Call        SEND REFERRAL TO [facility]  WAITING FOR AN APPT .Marland KitchenCheryll Dessert  June 29, 2010 11:56 AM AND ALSO TO [name]  WAITING FOR AN APPT  Follow-up by: [name],  July 04, 2010 11:04 AM

## 2010-10-17 ENCOUNTER — Encounter (INDEPENDENT_AMBULATORY_CARE_PROVIDER_SITE_OTHER): Payer: Self-pay | Admitting: Internal Medicine

## 2010-10-18 ENCOUNTER — Telehealth (INDEPENDENT_AMBULATORY_CARE_PROVIDER_SITE_OTHER): Payer: Self-pay | Admitting: Internal Medicine

## 2010-10-25 ENCOUNTER — Other Ambulatory Visit (HOSPITAL_COMMUNITY): Payer: Self-pay | Admitting: Internal Medicine

## 2010-10-25 DIAGNOSIS — Z1231 Encounter for screening mammogram for malignant neoplasm of breast: Secondary | ICD-10-CM

## 2010-10-27 NOTE — Letter (Signed)
Summary: [facility] //FAXED  [facility] //FAXED   Imported By: Arta Bruce 10/17/2010 15:39:12  _____________________________________________________________________  External Attachment:    Type:   Image     Comment:   External Document

## 2010-10-27 NOTE — Progress Notes (Signed)
Summary: Needing dates for Dx  Phone Note Other Incoming   Caller: Tonya from [facility] Summary of Call: Tonya called needing the Dx dates for Dementia and ? of Idiopathic Pressure Hydrocephalus -- Per. Dr. Delrae Alfred the date for Dementia is 04/27/10 and for ? of Idiopahic Pressure Hydrocephalus is 07/15/10. -- Hale Drone CMA  October 18, 2010 11:01 AM

## 2010-10-28 ENCOUNTER — Encounter (INDEPENDENT_AMBULATORY_CARE_PROVIDER_SITE_OTHER): Payer: Self-pay | Admitting: Internal Medicine

## 2010-10-28 ENCOUNTER — Encounter: Payer: Self-pay | Admitting: Internal Medicine

## 2010-11-01 NOTE — Assessment & Plan Note (Signed)
Summary: 3 month f/u on dementia   Vital Signs:  Patient profile:   65 year old female Weight:      162.19 pounds Temp:     98.3 degrees F oral Pulse rate:   75 / minute Pulse rhythm:   regular Resp:     22 per minute BP sitting:   141 / 81  (left arm) Cuff size:   regular  Vitals Entered By: Hale Drone CMA (October 28, 2010 11:28 AM) CC: 3 month f/u on Dementia... Dr. Terrace Arabia [facility]) is not concerned about her long term memory but is concerned about her short term memory.... Has an MRI appt. this afternoon @ 3:30 of her head.  Is Patient Diabetic? No Pain Assessment Patient in pain? no       Does patient need assistance? Functional Status Self care Ambulation Normal   CC:  3 month f/u on Dementia... Dr. Terrace Arabia [facility]) is not concerned about her long term memory but is concerned about her short term memory.... Has an MRI appt. this afternoon @ 3:30 of her head. Marland Kitchen  History of Present Illness: 1.  Dementia:  Saw Dr. Terrace Arabia at end of February.  Planning for MRI this afternoon to further evaluate for possible NPH.  Sounds like unlikely to consider a shunt if is NPH as felt to be chronic at this point and unlikely to have improvement with this intervention.  Did not continue the Aricpet with higher likelihood of NPH.    2.  Abdominal discomfort and diarrhea:  Hx of diverticulosis on colonoscopy.  Discussed likely cause of symptoms    Current Medications (verified): 1)  Hydrochlorothiazide 12.5 Mg Caps (Hydrochlorothiazide) .Marland Kitchen.. 1 Cap By Mouth Daily 2)  Aricept 5 Mg Tabs (Donepezil Hcl) .Marland Kitchen.. 1 Tab By Mouth Daily  Allergies (verified): 1)  Amoxicillin 2)  Septra  Physical Exam  General:  NAD Lungs:  Normal respiratory effort, chest expands symmetrically. Lungs are clear to auscultation, no crackles or wheezes. Heart:  Normal rate and regular rhythm. S1 and S2 normal without gallop, murmur, click, rub or other extra sounds.  Radial pulses normal and  equal   Impression & Recommendations:  Problem # 1:  ? 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 discussed high fiber diet.  Complete Medication List: 1)  Hydrochlorothiazide 12.5 Mg Caps (Hydrochlorothiazide) .Marland Kitchen.. 1 cap by mouth daily 2)  Aricept 5 Mg Tabs (Donepezil hcl) .Marland Kitchen.. 1 tab by mouth daily  Patient Instructions: 1)  Follow up with Dr. Delrae Alfred in 4 months --CPP   Orders Added: 1)  Est. Patient Level III [28413]

## 2010-12-01 ENCOUNTER — Ambulatory Visit (HOSPITAL_COMMUNITY)
Admission: RE | Admit: 2010-12-01 | Discharge: 2010-12-01 | Disposition: A | Discharge status: Home / Self Care | Payer: Medicaid Other | Source: Ambulatory Visit | Attending: Internal Medicine | Admitting: Internal Medicine

## 2010-12-01 DIAGNOSIS — Z1231 Encounter for screening mammogram for malignant neoplasm of breast: Secondary | ICD-10-CM | POA: Insufficient documentation

## 2011-09-26 ENCOUNTER — Other Ambulatory Visit (HOSPITAL_COMMUNITY): Payer: Self-pay | Admitting: Physician Assistant

## 2011-09-26 DIAGNOSIS — Z1231 Encounter for screening mammogram for malignant neoplasm of breast: Secondary | ICD-10-CM

## 2011-12-04 ENCOUNTER — Ambulatory Visit (HOSPITAL_COMMUNITY)
Admission: RE | Admit: 2011-12-04 | Discharge: 2011-12-04 | Disposition: A | Discharge status: Home / Self Care | Payer: Self-pay | Source: Ambulatory Visit | Attending: Physician Assistant | Admitting: Physician Assistant

## 2011-12-04 DIAGNOSIS — Z1231 Encounter for screening mammogram for malignant neoplasm of breast: Secondary | ICD-10-CM | POA: Insufficient documentation

## 2012-07-18 ENCOUNTER — Emergency Department (HOSPITAL_COMMUNITY)
Admission: EM | Admit: 2012-07-18 | Discharge: 2012-07-18 | Disposition: A | Discharge status: Home / Self Care | Payer: Medicaid Other | Attending: Emergency Medicine | Admitting: Emergency Medicine

## 2012-07-18 ENCOUNTER — Encounter (HOSPITAL_COMMUNITY): Payer: Self-pay | Admitting: *Deleted

## 2012-07-18 ENCOUNTER — Emergency Department (HOSPITAL_COMMUNITY): Payer: Medicaid Other

## 2012-07-18 DIAGNOSIS — R51 Headache: Secondary | ICD-10-CM

## 2012-07-18 DIAGNOSIS — I10 Essential (primary) hypertension: Secondary | ICD-10-CM | POA: Insufficient documentation

## 2012-07-18 DIAGNOSIS — Z8659 Personal history of other mental and behavioral disorders: Secondary | ICD-10-CM | POA: Insufficient documentation

## 2012-07-18 HISTORY — DX: Essential (primary) hypertension: I10

## 2012-07-18 NOTE — ED Notes (Signed)
Patient with history "fluid leaking from her brain," per family member and yesterday started having intermittent pain in her right side of her head.  Denies any blurry vision.

## 2012-07-18 NOTE — ED Provider Notes (Signed)
History     CSN: [id number]  Arrival date & time 07/18/12  1750   First MD Initiated Contact with Patient 07/18/12 1844      Chief Complaint  Patient presents with  . Headache    (Consider location/radiation/quality/duration/timing/severity/associated sxs/prior treatment) HPI Comments: Patient presents today with a chief complaint of a headache.  She reports that the headache has been intermittent since yesterday.  The headache is located on the right side of skull.  Headache typically lasts a few seconds and then resolves.  She describes the pain as a shooting pain.  She reports that she has had similar headaches in the past.  She denies fever or chills.  Denies vision changes.  Denies neck pain or stiffness.  No bowel or bladder incontinence.  Review of the chart shows that the patient was evaluated in March 2012 after a CT head showed possible NPH.   She was supposed to follow up with a MRI.  She states that she did have the MRI, however, there are no results in the system.  She saw Dr. Terrace Arabia with Neurology at that time.  She does not have a shunt.  She does have a history of Dementia.  The history is provided by the patient.    Past Medical History  Diagnosis Date  . Hypertension     Past Surgical History  Procedure Date  . Partial hysterectomy     No family history on file.  History  Substance Use Topics  . Smoking status: Never Smoker   . Smokeless tobacco: Not on file  . Alcohol Use: No    OB History    Grav Para Term Preterm Abortions TAB SAB Ect Mult Living                  Review of Systems  Constitutional: Negative for fever and chills.  HENT: Negative for neck pain and neck stiffness.   Eyes: Negative for photophobia, pain and visual disturbance.  Gastrointestinal: Negative for nausea and vomiting.  Musculoskeletal: Negative for gait problem.  Skin: Negative for rash.  Neurological: Positive for headaches. Negative for dizziness, tremors, seizures,  syncope, facial asymmetry, speech difficulty, weakness, light-headedness and numbness.  All other systems reviewed and are negative.    Allergies  Amoxicillin and Ceftin  Home Medications  No current outpatient prescriptions on file.  BP 183/89  Pulse 93  Temp 98.3 F (36.8 C) (Oral)  Resp 20  SpO2 97%  Physical Exam  Nursing note and vitals reviewed. Constitutional: She appears well-developed and well-nourished.  HENT:  Head: Normocephalic and atraumatic.  Mouth/Throat: Oropharynx is clear and moist.  Eyes: EOM are normal. Pupils are equal, round, and reactive to light.  Neck: Normal range of motion. Neck supple.  Cardiovascular: Normal rate, regular rhythm and normal heart sounds.   Pulmonary/Chest: Effort normal and breath sounds normal. No respiratory distress. She has no wheezes.  Musculoskeletal: Normal range of motion.  Neurological: She is alert. She has normal strength. No cranial nerve deficit or sensory deficit. Coordination and gait normal.       Normal rapid alternating movements Normal finger to nose testing Normal gait, no ataxia. No tenderness to palpation of the temporal artery bilaterally.  Skin: Skin is warm and dry. No rash noted.  Psychiatric: She has a normal mood and affect.    ED Course  Procedures (including critical care time)  Labs Reviewed - No data to display Ct Head Wo Contrast  07/18/2012  *RADIOLOGY REPORT*  Clinical Data: Headache.  CT HEAD WITHOUT CONTRAST  Technique:  Contiguous axial images were obtained from the base of the skull through the vertex without contrast.  Comparison: Head CT 07/15/2010.  Findings: Again noted is periventricular white matter volume loss with marked enlargement of the lateral ventricles (particularly the the atria and temporal horns) bilaterally.  Overall appearance is unchanged compared to prior study 07/15/2010.  The third and fourth ventricles are normal in size.  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  No acute intracranial abnormalities.  Specifically, no definite signs of acute/subacute cerebral ischemia, no evidence of acute intracerebral hemorrhage, no definite evidence of hydrocephalus or mass.  Visualized paranasal sinuses and mastoids are well pneumatized.  No acute displaced skull fractures are identified.  IMPRESSION: 1.  No acute intracranial abnormalities. 2.  The appearance of the brain is unchanged compared to remote prior study 07/15/2010, as detailed above.   Original Report Authenticated By: Trudie Reed, M.D.      No diagnosis found.  7:43 PM Discussed case and Head CT with Dr. Roseanne Reno with Neurology.  He recommends treating the headache symptomatically, but does not feel that further work up needs to be done at this time.  MDM  Patient with history of possible NPH presents today with intermittent headache.  She denies vision changes or incontinence.  Head CT unchanged.  She has a normal neurological exam.  No neck stiffness or fever.  Discussed results of the CT with Dr. Roseanne Reno who is on call for Neurology.  He recommends treating her headache systomatically.  Feel that patient can be discharged home.  Patient does not have headache at time of discharge. Patient instructed to follow up with Neurology if HA continues.        Pascal Lux Laporte, PA-C 07/20/12 [phone]

## 2012-07-21 NOTE — ED Provider Notes (Signed)
Medical screening examination/treatment/procedure(s) were conducted as a shared visit with non-physician practitioner(s) and myself.  I personally evaluated the patient during the encounter  Intermittent R sided "tingling" in head lasting a few seconds at a time.  Similar to previous episodes. No weakness, numbness, tingling, visual change, fever, neck pain, temporal artery tenderness. Doubt SAH or meningitis.  Glynn Octave, MD 07/21/12 [phone]

## 2012-08-09 ENCOUNTER — Other Ambulatory Visit: Payer: Self-pay

## 2012-08-09 ENCOUNTER — Emergency Department (HOSPITAL_COMMUNITY)
Admission: EM | Admit: 2012-08-09 | Discharge: 2012-08-09 | Disposition: A | Discharge status: Home / Self Care | Payer: Medicaid Other | Attending: Emergency Medicine | Admitting: Emergency Medicine

## 2012-08-09 ENCOUNTER — Encounter (HOSPITAL_COMMUNITY): Payer: Self-pay | Admitting: Neurology

## 2012-08-09 DIAGNOSIS — G911 Obstructive hydrocephalus: Secondary | ICD-10-CM | POA: Insufficient documentation

## 2012-08-09 DIAGNOSIS — I10 Essential (primary) hypertension: Secondary | ICD-10-CM | POA: Insufficient documentation

## 2012-08-09 DIAGNOSIS — R110 Nausea: Secondary | ICD-10-CM | POA: Insufficient documentation

## 2012-08-09 DIAGNOSIS — R42 Dizziness and giddiness: Secondary | ICD-10-CM | POA: Insufficient documentation

## 2012-08-09 LAB — BASIC METABOLIC PANEL
CO2: 26 mEq/L (ref 19–32)
Calcium: 9.1 mg/dL (ref 8.4–10.5)
Creatinine, Ser: 0.69 mg/dL (ref 0.50–1.10)
Glucose, Bld: 105 mg/dL — ABNORMAL HIGH (ref 70–99)

## 2012-08-09 LAB — CBC WITH DIFFERENTIAL/PLATELET
Basophils Absolute: 0 10*3/uL (ref 0.0–0.1)
Eosinophils Relative: 1 % (ref 0–5)
HCT: 37.6 % (ref 36.0–46.0)
Lymphocytes Relative: 27 % (ref 12–46)
MCH: 25.9 pg — ABNORMAL LOW (ref 26.0–34.0)
MCV: 80.3 fL (ref 78.0–100.0)
Monocytes Absolute: 0.4 10*3/uL (ref 0.1–1.0)
RDW: 13.6 % (ref 11.5–15.5)
WBC: 6.4 10*3/uL (ref 4.0–10.5)

## 2012-08-09 NOTE — ED Provider Notes (Signed)
History     CSN: [id number]  Arrival date & time 08/09/12  1001   First MD Initiated Contact with Patient 08/09/12 1003      Chief Complaint  Patient presents with  . Dizziness    (Consider location/radiation/quality/duration/timing/severity/associated sxs/prior treatment) HPI Comments: Patient with history of hydrocephalus, high blood pressure -- presents after having an episode of "dizziness" this morning. Patient does not describe a sensation of spinning. She describes a sensation of mild lightheadedness. She did not fall or have trouble walking. In fact, she walked over to a neighbor's house and they called the ambulance. Patient also describes feeling nauseous without vomiting. Patient was evaluated in emergency department a month ago for headache and had a negative head CT. She was recently seen by her neurologist who is monitoring her hydrocephalus. No urinary incontinence or gait problems per patient and family. She does have a history of dementia. She denies chest pain, palpitations, shortness of breath, abdominal pain, vomiting, urinary symptoms. Patient has been eating and drinking well recently. Onset acute. Course is improving. Nothing makes symptoms better or worse  The history is provided by the patient, a relative and medical records.    Past Medical History  Diagnosis Date  . Hypertension     Past Surgical History  Procedure Date  . Partial hysterectomy     No family history on file.  History  Substance Use Topics  . Smoking status: Never Smoker   . Smokeless tobacco: Not on file  . Alcohol Use: No    OB History    Grav Para Term Preterm Abortions TAB SAB Ect Mult Living                  Review of Systems  Constitutional: Negative for fatigue.  HENT: Negative for ear pain, neck pain and tinnitus.   Eyes: Negative for photophobia, pain and visual disturbance.  Respiratory: Negative for shortness of breath.   Cardiovascular: Negative for chest pain.   Gastrointestinal: Positive for nausea. Negative for vomiting.  Genitourinary: Negative for enuresis and difficulty urinating.  Musculoskeletal: Negative for back pain and gait problem.  Skin: Negative for wound.  Neurological: Positive for dizziness and light-headedness. Negative for syncope, speech difficulty, weakness, numbness and headaches.  Psychiatric/Behavioral: Negative for confusion and decreased concentration.    Allergies  Amoxicillin and Ceftin  Home Medications   Current Outpatient Rx  Name  Route  Sig  Dispense  Refill  . ACETAMINOPHEN 500 MG PO TABS   Oral   Take 500 mg by mouth every 6 (six) hours as needed. For tooth pain         . CLINDAMYCIN HCL 150 MG PO CAPS   Oral   Take 600 mg by mouth once. Prior to dental procedure.         Marland Kitchen HYDROCODONE-ACETAMINOPHEN 7.5-325 MG PO TABS   Oral   Take 2 tablets by mouth every 6 (six) hours as needed.           BP 154/86  Pulse 72  Temp 98.4 F (36.9 C) (Oral)  Resp 16  SpO2 96%  Physical Exam  Nursing note and vitals reviewed. Constitutional: She is oriented to person, place, and time. She appears well-developed and well-nourished.  HENT:  Head: Normocephalic and atraumatic.  Right Ear: Tympanic membrane, external ear and ear canal normal.  Left Ear: Tympanic membrane, external ear and ear canal normal.  Nose: Nose normal.  Mouth/Throat: Uvula is midline, oropharynx is clear and moist and  mucous membranes are normal.  Eyes: Conjunctivae normal, EOM and lids are normal. Pupils are equal, round, and reactive to light. Right eye exhibits no discharge. Left eye exhibits no discharge. Right eye exhibits no nystagmus. Left eye exhibits no nystagmus.  Neck: Normal range of motion. Neck supple.  Cardiovascular: Normal rate, regular rhythm and normal heart sounds.   No murmur heard. Pulmonary/Chest: Effort normal and breath sounds normal. No respiratory distress. She has no wheezes. She has no rales.   Abdominal: Soft. There is no tenderness. There is no rebound and no guarding.  Musculoskeletal: She exhibits no edema and no tenderness.       Cervical back: She exhibits normal range of motion, no tenderness and no bony tenderness.       No lower extremity swelling or edema.  Neurological: She is alert and oriented to person, place, and time. She has normal strength and normal reflexes. No cranial nerve deficit or sensory deficit. She displays a negative Romberg sign. Coordination normal. GCS eye subscore is 4. GCS verbal subscore is 5. GCS motor subscore is 6.       Dizziness cannot be elicited by movement of head.  Skin: Skin is warm and dry.  Psychiatric: She has a normal mood and affect.    ED Course  Procedures (including critical care time)  Labs Reviewed  CBC WITH DIFFERENTIAL - Abnormal; Notable for the following:    MCH 25.9 (*)     All other components within normal limits  BASIC METABOLIC PANEL - Abnormal; Notable for the following:    Glucose, Bld 105 (*)     All other components within normal limits   No results found.   1. Lightheadedness     10:44 AM Patient seen and examined. Work-up initiated. Not currently 'dizzy'.    Vital signs reviewed and are as follows: Filed Vitals:   08/09/12 1009  BP: 154/86  Pulse: 72  Temp: 98.4 F (36.9 C)  Resp: 16    Date: 08/09/2012  Rate: 70    Rhythm: normal sinus rhythm  QRS Axis: normal  Intervals: normal  ST/T Wave abnormalities: normal  Conduction Disutrbances:none  Narrative Interpretation:   Old EKG Reviewed: none available  12:22 PM Patient seen by and discussed with Dr. Adriana Simas. Patient's work-up is negative. Patient is able to go home with PCP follow-up. She has been up ambulating in the hallway without difficulty. She looks well. She states she will follow-up with her PCP regarding symptoms and in regards to her blood pressure.   Patient urged to return with worsening symptoms or other concerns. Patient  verbalized understanding and agrees with plan.   MDM  Patient with transient dizziness/lightheadedness. No full syncope. No palpitations or chest pain. EKG normal. Low concern for cardiac etiology or arrhythmia. Doubt TIA given no other neurological symptoms and low risk factor profile. Patient appears well and seems appropriate for outpatient follow-up.        Renne Crigler, [place] 08/09/12 [phone]

## 2012-08-09 NOTE — ED Notes (Signed)
To Ed via [employer] [facility], became dizzy this am with some nausea. Has not taken BP meds for "awhile" due to [facility] closing. Walked to neighbor's "Just not acting right- sluggish to answer" denies h/a, denies LOC,

## 2012-08-13 NOTE — ED Provider Notes (Signed)
Medical screening examination/treatment/procedure(s) were conducted as a shared visit with non-physician practitioner(s) and myself.  I personally evaluated the patient during the encounter.  Normal neuro exam. Can be followed up as outpatient.  Donnetta Hutching, MD 08/13/12 Jerene Bears

## 2012-11-27 ENCOUNTER — Other Ambulatory Visit (HOSPITAL_COMMUNITY): Payer: Self-pay | Admitting: Family Medicine

## 2012-11-27 DIAGNOSIS — Z1231 Encounter for screening mammogram for malignant neoplasm of breast: Secondary | ICD-10-CM

## 2012-11-28 ENCOUNTER — Other Ambulatory Visit: Payer: Self-pay | Admitting: Gastroenterology

## 2012-12-05 ENCOUNTER — Ambulatory Visit (HOSPITAL_COMMUNITY): Payer: Medicaid Other | Attending: Family Medicine

## 2013-01-29 ENCOUNTER — Ambulatory Visit (HOSPITAL_COMMUNITY)
Admission: RE | Admit: 2013-01-29 | Discharge: 2013-01-29 | Disposition: A | Discharge status: Home / Self Care | Payer: Medicaid Other | Source: Ambulatory Visit | Attending: Family Medicine | Admitting: Family Medicine

## 2013-01-29 DIAGNOSIS — Z1231 Encounter for screening mammogram for malignant neoplasm of breast: Secondary | ICD-10-CM | POA: Insufficient documentation

## 2013-02-04 ENCOUNTER — Ambulatory Visit: Payer: Medicaid Other | Admitting: Nurse Practitioner

## 2013-02-07 ENCOUNTER — Encounter: Payer: Self-pay | Admitting: Neurology

## 2013-02-07 ENCOUNTER — Ambulatory Visit: Payer: Medicaid Other | Admitting: Nurse Practitioner

## 2013-02-07 ENCOUNTER — Ambulatory Visit (INDEPENDENT_AMBULATORY_CARE_PROVIDER_SITE_OTHER): Payer: Medicaid Other | Admitting: Neurology

## 2013-02-07 VITALS — BP 150/93 | HR 87 | Ht 66.0 in | Wt 169.0 lb

## 2013-02-07 DIAGNOSIS — G919 Hydrocephalus, unspecified: Secondary | ICD-10-CM

## 2013-02-07 DIAGNOSIS — F068 Other specified mental disorders due to known physiological condition: Secondary | ICD-10-CM

## 2013-02-07 DIAGNOSIS — G911 Obstructive hydrocephalus: Secondary | ICD-10-CM

## 2013-02-07 DIAGNOSIS — R51 Headache: Secondary | ICD-10-CM

## 2013-02-07 MED ORDER — TOPIRAMATE ER 50 MG PO CAP24: 50.0000 mg | ORAL_CAPSULE | Freq: Every day | ORAL | Status: DC
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
## 2013-02-12 ENCOUNTER — Telehealth: Payer: Self-pay

## 2013-02-12 NOTE — Telephone Encounter (Signed)
Reviewed assessment and plan of patient's visit. Answered questions regarding rationale for using Topamax. Discussed date of next office visit. Also discussed the great benefits of of registering for mychart. Emily Brown thanked me and said she would register.

## 2013-02-12 NOTE — Telephone Encounter (Signed)
Message copied by [facility] on Wed Feb 12, 2013  6:18 PM ------      Message from: [facility], [place]      Created: Fri Feb 07, 2013  3:03 PM      Contact: [name]/pt's sister       Pt's sister is calling and has questions about Ms. Howeth's visit today. Pt had to come by herself and she has memory problems and she needs to know what needs to be done as far as this visit and has questions regarding the meds she got today.  [phone] [name] ------

## 2013-05-12 ENCOUNTER — Ambulatory Visit: Payer: Medicaid Other | Admitting: Nurse Practitioner

## 2013-06-28 ENCOUNTER — Emergency Department (HOSPITAL_COMMUNITY)
Admission: EM | Admit: 2013-06-28 | Discharge: 2013-06-29 | Disposition: A | Discharge status: Home / Self Care | Payer: Medicare HMO | Attending: Emergency Medicine | Admitting: Emergency Medicine

## 2013-06-28 ENCOUNTER — Encounter (HOSPITAL_COMMUNITY): Payer: Self-pay | Admitting: Emergency Medicine

## 2013-06-28 DIAGNOSIS — R197 Diarrhea, unspecified: Secondary | ICD-10-CM | POA: Insufficient documentation

## 2013-06-28 DIAGNOSIS — I10 Essential (primary) hypertension: Secondary | ICD-10-CM | POA: Insufficient documentation

## 2013-06-28 DIAGNOSIS — Z79899 Other long term (current) drug therapy: Secondary | ICD-10-CM | POA: Insufficient documentation

## 2013-06-28 DIAGNOSIS — R1084 Generalized abdominal pain: Secondary | ICD-10-CM

## 2013-06-28 DIAGNOSIS — Z880 Allergy status to penicillin: Secondary | ICD-10-CM | POA: Insufficient documentation

## 2013-06-28 MED ORDER — SODIUM CHLORIDE 0.9 % IV BOLUS (SEPSIS)
1000.0000 mL | Freq: Once | INTRAVENOUS | Status: AC
  Administered 2013-06-29: 1000 mL via INTRAVENOUS

## 2013-06-28 MED ORDER — DICYCLOMINE HCL 10 MG/ML IM SOLN
20.0000 mg | Freq: Once | INTRAMUSCULAR | Status: AC
  Administered 2013-06-29: 20 mg via INTRAMUSCULAR
  Filled 2013-06-28: qty 2

## 2013-06-28 NOTE — ED Notes (Signed)
Pt states she has been having abd pain for about 1.5 hours and has been having feelings similar since the 1970's.  Pt states she had a BM after the pain stated and it seemed to help the pain.

## 2013-06-29 LAB — URINALYSIS, ROUTINE W REFLEX MICROSCOPIC
Glucose, UA: NEGATIVE mg/dL
Hgb urine dipstick: NEGATIVE
pH: 6 (ref 5.0–8.0)

## 2013-06-29 LAB — COMPREHENSIVE METABOLIC PANEL
Albumin: 3.7 g/dL (ref 3.5–5.2)
Alkaline Phosphatase: 89 U/L (ref 39–117)
BUN: 14 mg/dL (ref 6–23)
Chloride: 104 mEq/L (ref 96–112)
GFR calc Af Amer: 82 mL/min — ABNORMAL LOW (ref >90)
Glucose, Bld: 118 mg/dL — ABNORMAL HIGH (ref 70–99)
Potassium: 3.9 mEq/L (ref 3.5–5.1)
Total Bilirubin: 0.2 mg/dL — ABNORMAL LOW (ref 0.3–1.2)

## 2013-06-29 LAB — CBC WITH DIFFERENTIAL/PLATELET
Basophils Relative: 0 % (ref 0–1)
HCT: 39.5 % (ref 36.0–46.0)
Hemoglobin: 13 g/dL (ref 12.0–15.0)
Lymphs Abs: 1.8 10*3/uL (ref 0.7–4.0)
MCH: 26.2 pg (ref 26.0–34.0)
MCHC: 32.9 g/dL (ref 30.0–36.0)
Monocytes Absolute: 0.7 10*3/uL (ref 0.1–1.0)
Monocytes Relative: 7 % (ref 3–12)
Neutro Abs: 6.5 10*3/uL (ref 1.7–7.7)
Neutrophils Relative %: 71 % (ref 43–77)
RBC: 4.97 MIL/uL (ref 3.87–5.11)

## 2013-06-29 LAB — URINE MICROSCOPIC-ADD ON

## 2013-06-29 LAB — LIPASE, BLOOD: Lipase: 31 U/L (ref 11–59)

## 2013-06-29 MED ORDER — DICYCLOMINE HCL 20 MG PO TABS: 20.0000 mg | ORAL_TABLET | Freq: Two times a day (BID) | ORAL | Status: DC | PRN

## 2013-06-29 NOTE — ED Provider Notes (Signed)
CSN: [id number]     Arrival date & time 06/28/13  2255 History   First MD Initiated Contact with Patient 06/28/13 2306     Chief Complaint  Patient presents with  . Abdominal Pain   (Consider location/radiation/quality/duration/timing/severity/associated sxs/prior Treatment) HPI Patient presents with 2 hours of diffuse abdominal cramping and loose. She's had several episodes. Patient states abdominal cramping is eased after having a bowel movement. She denies any fevers or chills. She denies any nausea or vomiting. No known sick contacts or questionable food ingestion. No recent travel. Past Medical History  Diagnosis Date  . Hypertension    Past Surgical History  Procedure Laterality Date  . Partial hysterectomy    . Left breast tumor removal     No family history on file. History  Substance Use Topics  . Smoking status: Never Smoker   . Smokeless tobacco: Never Used  . Alcohol Use: No   OB History   Grav Para Term Preterm Abortions TAB SAB Ect Mult Living                 Review of Systems  Constitutional: Negative for fever and chills.  Respiratory: Negative for shortness of breath.   Cardiovascular: Negative for chest pain.  Gastrointestinal: Positive for abdominal pain and diarrhea. Negative for nausea, vomiting, constipation, blood in stool and abdominal distention.  Genitourinary: Negative for dysuria, frequency, hematuria, flank pain and difficulty urinating.  Musculoskeletal: Negative for back pain and myalgias.  Skin: Negative for rash.  Neurological: Negative for dizziness, syncope, weakness, light-headedness, numbness and headaches.  All other systems reviewed and are negative.    Allergies  Amoxicillin; Ceftin; and Penicillins  Home Medications   Current Outpatient Rx  Name  Route  Sig  Dispense  Refill  . lisinopril (PRINIVIL,ZESTRIL) 10 MG tablet      10 mg. Take 1 tablet (10 mg total) by mouth daily.         Marland Kitchen dicyclomine (BENTYL) 20 MG tablet   Oral   Take 1 tablet (20 mg total) by mouth 2 (two) times daily as needed (abdominal cramps).   20 tablet   0    BP 128/71  Pulse 82  Temp(Src) 98.1 F (36.7 C) (Oral)  Resp 14  SpO2 97% Physical Exam  Nursing note and vitals reviewed. Constitutional: She is oriented to person, place, and time. She appears well-developed and well-nourished. No distress.  HENT:  Head: Normocephalic and atraumatic.  Mouth/Throat: Oropharynx is clear and moist.  Eyes: EOM are normal. Pupils are equal, round, and reactive to light.  Neck: Normal range of motion. Neck supple.  Cardiovascular: Normal rate and regular rhythm.   Pulmonary/Chest: Effort normal and breath sounds normal. No respiratory distress. She has no wheezes. She has no rales.  Abdominal: Soft. Bowel sounds are normal. She exhibits no distension and no mass. There is no tenderness. There is no rebound and no guarding.  Musculoskeletal: Normal range of motion. She exhibits no edema and no tenderness.  Neurological: She is alert and oriented to person, place, and time.  Skin: Skin is warm and dry. No rash noted. No erythema.  Psychiatric: She has a normal mood and affect. Her behavior is normal.    ED Course  Procedures (including critical care time) Labs Review Labs Reviewed  COMPREHENSIVE METABOLIC PANEL - Abnormal; Notable for the following:    Glucose, Bld 118 (*)    Total Bilirubin 0.2 (*)    GFR calc non Af Amer 70 (*)  GFR calc Af Amer 82 (*)    All other components within normal limits  URINALYSIS, ROUTINE W REFLEX MICROSCOPIC - Abnormal; Notable for the following:    Leukocytes, UA TRACE (*)    All other components within normal limits  CBC WITH DIFFERENTIAL  TROPONIN I  LIPASE, BLOOD  URINE MICROSCOPIC-ADD ON   Imaging Review No results found.  EKG Interpretation   None       MDM   1. Diarrhea   2. Generalized abdominal cramps    Abdominal exam is benign. Labs are unremarkable. On reexam patient is  having no discomfort whatsoever. Her vital signs have been stable during her entire stay. We'll discharge home with precautions to return for increasing pain, persistent vomiting, blood in stool, fever, any concerns.Loren Racer, MD 06/29/13 [phone]

## 2013-11-03 ENCOUNTER — Other Ambulatory Visit (HOSPITAL_COMMUNITY): Payer: Self-pay | Admitting: Family Medicine

## 2013-11-03 DIAGNOSIS — Z1231 Encounter for screening mammogram for malignant neoplasm of breast: Secondary | ICD-10-CM

## 2013-11-03 DIAGNOSIS — M8580 Other specified disorders of bone density and structure, unspecified site: Secondary | ICD-10-CM

## 2013-11-03 DIAGNOSIS — Q782 Osteopetrosis: Secondary | ICD-10-CM

## 2014-02-02 ENCOUNTER — Ambulatory Visit (HOSPITAL_COMMUNITY)
Admission: RE | Admit: 2014-02-02 | Discharge: 2014-02-02 | Disposition: A | Discharge status: Home / Self Care | Payer: Medicare HMO | Source: Ambulatory Visit | Attending: Family Medicine | Admitting: Family Medicine

## 2014-02-02 DIAGNOSIS — M8580 Other specified disorders of bone density and structure, unspecified site: Secondary | ICD-10-CM

## 2014-02-02 DIAGNOSIS — Z780 Asymptomatic menopausal state: Secondary | ICD-10-CM | POA: Insufficient documentation

## 2014-02-02 DIAGNOSIS — Z1231 Encounter for screening mammogram for malignant neoplasm of breast: Secondary | ICD-10-CM | POA: Insufficient documentation

## 2014-02-02 DIAGNOSIS — Z13820 Encounter for screening for osteoporosis: Secondary | ICD-10-CM | POA: Insufficient documentation

## 2014-06-10 ENCOUNTER — Ambulatory Visit: Payer: Self-pay | Admitting: Neurology

## 2014-06-29 ENCOUNTER — Encounter: Payer: Self-pay | Admitting: Neurology

## 2014-06-29 ENCOUNTER — Ambulatory Visit (INDEPENDENT_AMBULATORY_CARE_PROVIDER_SITE_OTHER): Payer: Medicare HMO | Admitting: Neurology

## 2014-06-29 DIAGNOSIS — K589 Irritable bowel syndrome without diarrhea: Secondary | ICD-10-CM | POA: Insufficient documentation

## 2014-06-29 DIAGNOSIS — R413 Other amnesia: Secondary | ICD-10-CM
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
## 2014-12-05 ENCOUNTER — Encounter (HOSPITAL_COMMUNITY): Payer: Self-pay

## 2014-12-05 ENCOUNTER — Emergency Department (HOSPITAL_COMMUNITY): Payer: Medicare HMO

## 2014-12-05 ENCOUNTER — Emergency Department (HOSPITAL_COMMUNITY)
Admission: EM | Admit: 2014-12-05 | Discharge: 2014-12-06 | Disposition: A | Discharge status: Home / Self Care | Payer: Medicare HMO | Attending: Emergency Medicine | Admitting: Emergency Medicine

## 2014-12-05 DIAGNOSIS — Z880 Allergy status to penicillin: Secondary | ICD-10-CM | POA: Insufficient documentation

## 2014-12-05 DIAGNOSIS — K5900 Constipation, unspecified: Secondary | ICD-10-CM | POA: Insufficient documentation

## 2014-12-05 DIAGNOSIS — K589 Irritable bowel syndrome without diarrhea: Secondary | ICD-10-CM | POA: Diagnosis not present

## 2014-12-05 DIAGNOSIS — R0602 Shortness of breath: Secondary | ICD-10-CM | POA: Diagnosis not present

## 2014-12-05 DIAGNOSIS — R079 Chest pain, unspecified: Secondary | ICD-10-CM | POA: Diagnosis present

## 2014-12-05 DIAGNOSIS — I10 Essential (primary) hypertension: Secondary | ICD-10-CM | POA: Insufficient documentation

## 2014-12-05 DIAGNOSIS — Z79899 Other long term (current) drug therapy: Secondary | ICD-10-CM | POA: Insufficient documentation

## 2014-12-05 LAB — CBC
HCT: 40.2 % (ref 36.0–46.0)
Hemoglobin: 13.1 g/dL (ref 12.0–15.0)
MCH: 26 pg (ref 26.0–34.0)
MCHC: 32.6 g/dL (ref 30.0–36.0)
MCV: 79.8 fL (ref 78.0–100.0)
Platelets: 311 10*3/uL (ref 150–400)
RBC: 5.04 MIL/uL (ref 3.87–5.11)
RDW: 13.3 % (ref 11.5–15.5)
WBC: 8.3 10*3/uL (ref 4.0–10.5)

## 2014-12-05 LAB — BASIC METABOLIC PANEL
Anion gap: 10 (ref 5–15)
BUN: 11 mg/dL (ref 6–23)
CHLORIDE: 105 mmol/L (ref 96–112)
CO2: 24 mmol/L (ref 19–32)
CREATININE: 0.91 mg/dL (ref 0.50–1.10)
Calcium: 9.5 mg/dL (ref 8.4–10.5)
GFR calc Af Amer: 75 mL/min — ABNORMAL LOW (ref >90)
GFR calc non Af Amer: 64 mL/min — ABNORMAL LOW (ref >90)
GLUCOSE: 96 mg/dL (ref 70–99)
POTASSIUM: 3.8 mmol/L (ref 3.5–5.1)
Sodium: 139 mmol/L (ref 135–145)

## 2014-12-05 LAB — I-STAT TROPONIN, ED: Troponin i, poc: 0 ng/mL (ref 0.00–0.08)

## 2014-12-05 NOTE — ED Provider Notes (Signed)
CSN: [id number]     Arrival date & time 12/05/14  1844 History   First MD Initiated Contact with Patient 12/05/14 2201     Chief Complaint  Patient presents with  . Chest Pain     (Consider location/radiation/quality/duration/timing/severity/associated sxs/prior Treatment) Patient is a 69 y.o. female presenting with shortness of breath. The history is provided by the patient. No language interpreter was used.  Shortness of Breath Severity:  Moderate Onset quality:  Gradual Duration:  3 days Timing:  Constant Progression:  Worsening Chronicity:  New Context: smoke exposure and URI   Relieved by:  Nothing Worsened by:  Nothing tried Ineffective treatments:  None tried Associated symptoms: no abdominal pain   Risk factors: no recent alcohol use, no hx of cancer, no hx of PE/DVT, no prolonged immobilization and no recent surgery   Pt was seen by her MD on Tuesday and advised she had a viral bronchitis.   Pt had increased shortness of breath while walking on Thursday.   Pt told family chest felt tight on Thursday.  (History is mostly from sister,  Sister is concerned that pt is constipated.  Pt denies any current discomfort.  Past Medical History  Diagnosis Date  . Hypertension   . Memory loss   . IBS (irritable bowel syndrome)    Past Surgical History  Procedure Laterality Date  . Partial hysterectomy    . Left breast tumor removal     Family History  Problem Relation Age of Onset  . Diabetes Mother   . Kidney disease Father    History  Substance Use Topics  . Smoking status: Never Smoker   . Smokeless tobacco: Never Used  . Alcohol Use: No   OB History    No data available     Review of Systems  Respiratory: Positive for shortness of breath.   Gastrointestinal: Negative for abdominal pain.  All other systems reviewed and are negative.     Allergies  Amoxicillin; Ceftin; and Penicillins  Home Medications   Prior to Admission medications   Medication Sig  Start Date End Date Taking? Authorizing Provider  dicyclomine (BENTYL) 20 MG tablet Take 1 tablet (20 mg total) by mouth 2 (two) times daily as needed (abdominal cramps). 06/29/13   Julianne Rice, MD  lisinopril (PRINIVIL,ZESTRIL) 10 MG tablet 10 mg. Take 1 tablet (10 mg total) by mouth daily. 08/23/12 08/23/13  Historical Provider, MD   BP 140/81 mmHg  Pulse 86  Temp(Src) 98.3 F (36.8 C) (Oral)  Resp 18  Ht 5\' 7"  (1.702 m)  Wt 165 lb (74.844 kg)  BMI 25.84 kg/m2  SpO2 99% Physical Exam  Constitutional: She is oriented to person, place, and time. She appears well-developed and well-nourished.  HENT:  Head: Normocephalic.  Right Ear: External ear normal.  Left Ear: External ear normal.  Nose: Nose normal.  Mouth/Throat: Oropharynx is clear and moist.  Eyes: EOM are normal. Pupils are equal, round, and reactive to light.  Neck: Normal range of motion.  Cardiovascular: Normal rate, regular rhythm and normal heart sounds.   Pulmonary/Chest: Effort normal and breath sounds normal.  Abdominal: Soft. She exhibits no distension.  Musculoskeletal: Normal range of motion.  Neurological: She is alert and oriented to person, place, and time.  Skin: Skin is warm.  Psychiatric: She has a normal mood and affect.  Nursing note and vitals reviewed.   ED Course  Procedures (including critical care time) Labs Review Labs Reviewed  BASIC METABOLIC PANEL - Abnormal; Notable  for the following:    GFR calc non Af Amer 64 (*)    GFR calc Af Amer 75 (*)    All other components within normal limits  CBC  I-STAT TROPOININ, ED   Results for orders placed or performed during the hospital encounter of 12/05/14  CBC  Result Value Ref Range   WBC 8.3 4.0 - 10.5 K/uL   RBC 5.04 3.87 - 5.11 MIL/uL   Hemoglobin 13.1 12.0 - 15.0 g/dL   HCT 40.2 36.0 - 46.0 %   MCV 79.8 78.0 - 100.0 fL   MCH 26.0 26.0 - 34.0 pg   MCHC 32.6 30.0 - 36.0 g/dL   RDW 13.3 11.5 - 15.5 %   Platelets 311 150 - 400 K/uL   Basic metabolic panel  Result Value Ref Range   Sodium 139 135 - 145 mmol/L   Potassium 3.8 3.5 - 5.1 mmol/L   Chloride 105 96 - 112 mmol/L   CO2 24 19 - 32 mmol/L   Glucose, Bld 96 70 - 99 mg/dL   BUN 11 6 - 23 mg/dL   Creatinine, Ser 0.91 0.50 - 1.10 mg/dL   Calcium 9.5 8.4 - 10.5 mg/dL   GFR calc non Af Amer 64 (L) >90 mL/min   GFR calc Af Amer 75 (L) >90 mL/min   Anion gap 10 5 - 15  Troponin I  Result Value Ref Range   Troponin I <0.03 <0.031 ng/mL  I-stat troponin, ED (not at [facility])  Result Value Ref Range   Troponin i, poc 0.00 0.00 - 0.08 ng/mL   Comment 3           Dg Chest 2 View  12/05/2014   CLINICAL DATA:  Patient with chest tightness for 2 days.  EXAM: CHEST  2 VIEW  COMPARISON:  None.  FINDINGS: The heart size and mediastinal contours are within normal limits. Both lungs are clear. The visualized skeletal structures are unremarkable.  IMPRESSION: No active cardiopulmonary disease.   Electronically Signed   By: Lovey Newcomer M.D.   On: 12/05/2014 20:22   Dg Abd 1 View  12/05/2014   CLINICAL DATA:  Chest pain, abdominal pain, IBS.  EXAM: ABDOMEN - 1 VIEW  COMPARISON:  None.  FINDINGS: Moderate volume of retained stool. No dilated bowel loops. No evidence of free intra-abdominal air on single-view. There are pelvic phleboliths, no additional radiopaque calculi. No osseous abnormalities are seen.  IMPRESSION: Moderate volume of retained stool.  No bowel obstruction.   Electronically Signed   By: Jeb Levering M.D.   On: 12/05/2014 23:39    Imaging Review Dg Chest 2 View  12/05/2014   CLINICAL DATA:  Patient with chest tightness for 2 days.  EXAM: CHEST  2 VIEW  COMPARISON:  None.  FINDINGS: The heart size and mediastinal contours are within normal limits. Both lungs are clear. The visualized skeletal structures are unremarkable.  IMPRESSION: No active cardiopulmonary disease.   Electronically Signed   By: Lovey Newcomer M.D.   On: 12/05/2014 20:22     EKG  Interpretation   Date/Time:  Saturday December 05 2014 18:50:15 EDT Ventricular Rate:  85 PR Interval:  142 QRS Duration: 72 QT Interval:  372 QTC Calculation: 442 R Axis:   63 Text Interpretation:  Normal sinus rhythm Normal ECG Confirmed by  ZACKOWSKI  MD, SCOTT ([id number]) on 12/05/2014 11:18:26 PM      MDM  Pt has a normal ekg.   Normal chest xray.  Kub does show constipation.   Pt given rx for miralax. Pt advised to see Dr. Jonni Sanger for recheck.   Final diagnoses:  Constipation, unspecified constipation type      Fransico Meadow, PA-C 12/06/14 0045  Daleen Bo, MD 12/06/14 [phone]

## 2014-12-05 NOTE — ED Provider Notes (Signed)
Medical screening examination/treatment/procedure(s) were conducted as a shared visit with non-physician practitioner(s) and myself.  I personally evaluated the patient during the encounter.   EKG Interpretation   Date/Time:  Saturday December 05 2014 18:50:15 EDT Ventricular Rate:  85 PR Interval:  142 QRS Duration: 72 QT Interval:  372 QTC Calculation: 442 R Axis:   63 Text Interpretation:  Normal sinus rhythm Normal ECG Confirmed by  Luma Clopper  MD, Trude Cansler ([id number]) on 12/05/2014 11:18:26 PM      Results for orders placed or performed during the hospital encounter of 12/05/14  CBC  Result Value Ref Range   WBC 8.3 4.0 - 10.5 K/uL   RBC 5.04 3.87 - 5.11 MIL/uL   Hemoglobin 13.1 12.0 - 15.0 g/dL   HCT 40.2 36.0 - 46.0 %   MCV 79.8 78.0 - 100.0 fL   MCH 26.0 26.0 - 34.0 pg   MCHC 32.6 30.0 - 36.0 g/dL   RDW 13.3 11.5 - 15.5 %   Platelets 311 150 - 400 K/uL  Basic metabolic panel  Result Value Ref Range   Sodium 139 135 - 145 mmol/L   Potassium 3.8 3.5 - 5.1 mmol/L   Chloride 105 96 - 112 mmol/L   CO2 24 19 - 32 mmol/L   Glucose, Bld 96 70 - 99 mg/dL   BUN 11 6 - 23 mg/dL   Creatinine, Ser 0.91 0.50 - 1.10 mg/dL   Calcium 9.5 8.4 - 10.5 mg/dL   GFR calc non Af Amer 64 (L) >90 mL/min   GFR calc Af Amer 75 (L) >90 mL/min   Anion gap 10 5 - 15  I-stat troponin, ED (not at [facility])  Result Value Ref Range   Troponin i, poc 0.00 0.00 - 0.08 ng/mL   Comment 3           Dg Chest 2 View  12/05/2014   CLINICAL DATA:  Patient with chest tightness for 2 days.  EXAM: CHEST  2 VIEW  COMPARISON:  None.  FINDINGS: The heart size and mediastinal contours are within normal limits. Both lungs are clear. The visualized skeletal structures are unremarkable.  IMPRESSION: No active cardiopulmonary disease.   Electronically Signed   By: Lovey Newcomer M.D.   On: 12/05/2014 20:22    Patient with history of dementia. Patient seen April 12 by provider for bronchitis. Patient's had onset of one week but  nonproductive cough. Onset of 2 days of intermittent chest tightness and shortness of breath at times. No shortness of breath now. Most information provided by family members. Due to the patient's history of memory loss. Chest x-rays negative for pneumonia or pulmonary edema. Patient without a leukocytosis first troponin was negative will do a follow-up troponin and if negative patient can be discharged home. Patient nontoxic no acute distress.  Fredia Sorrow, MD 12/05/14 [phone]

## 2014-12-05 NOTE — ED Notes (Signed)
Onset 1 week NP cough.  Pt seen 12-01-14 dx: Bronchitis.  Onset 2 days center intermittant chest tighness and shortness of breath at times.  No shortness of breath now.

## 2014-12-06 LAB — TROPONIN I

## 2014-12-06 MED ORDER — POLYETHYLENE GLYCOL 3350 17 G PO PACK: 17.0000 g | PACK | Freq: Every day | ORAL | Status: AC

## 2014-12-06 NOTE — Discharge Instructions (Signed)

## 2014-12-21 ENCOUNTER — Other Ambulatory Visit (HOSPITAL_COMMUNITY): Payer: Self-pay | Admitting: Family Medicine

## 2014-12-21 DIAGNOSIS — Z1231 Encounter for screening mammogram for malignant neoplasm of breast: Secondary | ICD-10-CM

## 2015-02-05 ENCOUNTER — Ambulatory Visit (HOSPITAL_COMMUNITY)
Admission: RE | Admit: 2015-02-05 | Discharge: 2015-02-05 | Disposition: A | Discharge status: Home / Self Care | Payer: Medicare HMO | Source: Ambulatory Visit | Attending: Family Medicine | Admitting: Family Medicine

## 2015-02-05 DIAGNOSIS — Z1231 Encounter for screening mammogram for malignant neoplasm of breast: Secondary | ICD-10-CM | POA: Diagnosis not present

## 2015-06-30 ENCOUNTER — Encounter: Payer: Self-pay | Admitting: Adult Health

## 2015-06-30 ENCOUNTER — Ambulatory Visit (INDEPENDENT_AMBULATORY_CARE_PROVIDER_SITE_OTHER): Payer: Medicare HMO | Admitting: Adult Health

## 2015-06-30 VITALS — BP 129/82 | HR 76 | Ht 63.0 in | Wt 176.0 lb

## 2015-06-30 DIAGNOSIS — Z8669 Personal history of other diseases of the nervous system and sense organs: Secondary | ICD-10-CM

## 2015-06-30 DIAGNOSIS — R413 Other amnesia: Secondary | ICD-10-CM | POA: Diagnosis not present

## 2015-06-30 NOTE — Patient Instructions (Signed)
We will repeat CT head to evaluate hydrocephalus Consider trying Aricept or namenda again If your symptoms worsen or you develop new symptoms please let us know.   Memantine Tablets What is this medicine? MEMANTINE (MEM an teen) is used to treat dementia caused by Alzheimer's disease. This medicine may be used for other purposes; ask your health care provider or pharmacist if you have questions. What should I tell my health care provider before I take this medicine? They need to know if you have any of these conditions: -difficulty passing urine -kidney disease -liver disease -seizures -an unusual or allergic reaction to memantine, other medicines, foods, dyes, or preservatives -pregnant or trying to get pregnant -breast-feeding How should I use this medicine? Take this medicine by mouth with a glass of water. Follow the directions on the prescription label. You may take this medicine with or without food. Take your doses at regular intervals. Do not take your medicine more often than directed. Continue to take your medicine even if you feel better. Do not stop taking except on the advice of your doctor or health care professional. Talk to your pediatrician regarding the use of this medicine in children. Special care may be needed. Overdosage: If you think you have taken too much of this medicine contact a poison control center or emergency room at once. NOTE: This medicine is only for you. Do not share this medicine with others. What if I miss a dose? If you miss a dose, take it as soon as you can. If it is almost time for your next dose, take only that dose. Do not take double or extra doses. If you do not take your medicine for several days, contact your health care provider. Your dose may need to be changed. What may interact with this  medicine? -acetazolamide -amantadine -cimetidine -dextromethorphan -dofetilide -hydrochlorothiazide -ketamine -metformin -methazolamide -quinidine -ranitidine -sodium bicarbonate -triamterene This list may not describe all possible interactions. Give your health care provider a list of all the medicines, herbs, non-prescription drugs, or dietary supplements you use. Also tell them if you smoke, drink alcohol, or use illegal drugs. Some items may interact with your medicine. What should I watch for while using this medicine? Visit your doctor or health care professional for regular checks on your progress. Check with your doctor or health care professional if there is no improvement in your symptoms or if they get worse. You may get drowsy or dizzy. Do not drive, use machinery, or do anything that needs mental alertness until you know how this drug affects you. Do not stand or sit up quickly, especially if you are an older patient. This reduces the risk of dizzy or fainting spells. Alcohol can make you more drowsy and dizzy. Avoid alcoholic drinks. What side effects may I notice from receiving this medicine? Side effects that you should report to your doctor or health care professional as soon as possible: -allergic reactions like skin rash, itching or hives, swelling of the face, lips, or tongue -agitation or a feeling of restlessness -depressed mood -dizziness -hallucinations -redness, blistering, peeling or loosening of the skin, including inside the mouth -seizures -vomiting Side effects that usually do not require medical attention (report to your doctor or health care professional if they continue or are bothersome): -constipation -diarrhea -headache -nausea -trouble sleeping This list may not describe all possible side effects. Call your doctor for medical advice about side effects. You may report side effects to FDA at 1-800-FDA-1088. Where should I keep  my medicine? Keep  out of the reach of children. Store at room temperature between 15 degrees and 30 degrees C (59 degrees and 86 degrees F). Throw away any unused medicine after the expiration date. NOTE: This sheet is a summary. It may not cover all possible information. If you have questions about this medicine, talk to your doctor, pharmacist, or health care provider.    2016, [employer] Standard. (2013-05-26 14:10:42) Donepezil tablets What is this medicine? DONEPEZIL (doe NEP e zil) is used to treat mild to moderate dementia caused by Alzheimer's disease. This medicine may be used for other purposes; ask your health care provider or pharmacist if you have questions. What should I tell my health care provider before I take this medicine? They need to know if you have any of these conditions: -asthma or other lung disease -difficulty passing urine -head injury -heart disease -history of irregular heartbeat -liver disease -seizures (convulsions) -stomach or intestinal disease, ulcers or stomach bleeding -an unusual or allergic reaction to donepezil, other medicines, foods, dyes, or preservatives -pregnant or trying to get pregnant -breast-feeding How should I use this medicine? Take this medicine by mouth with a glass of water. Follow the directions on the prescription label. You may take this medicine with or without food. Take this medicine at regular intervals. This medicine is usually taken before bedtime. Do not take it more often than directed. Continue to take your medicine even if you feel better. Do not stop taking except on your doctor's advice. If you are taking the 23 mg donepezil tablet, swallow it whole; do not cut, crush, or chew it. Talk to your pediatrician regarding the use of this medicine in children. Special care may be needed. Overdosage: If you think you have taken too much of this medicine contact a poison control center or emergency room at once. NOTE: This medicine is only  for you. Do not share this medicine with others. What if I miss a dose? If you miss a dose, take it as soon as you can. If it is almost time for your next dose, take only that dose, do not take double or extra doses. What may interact with this medicine? Do not take this medicine with any of the following medications: -certain medicines for fungal infections like itraconazole, fluconazole, posaconazole, and voriconazole -cisapride -dextromethorphan; quinidine -dofetilide -dronedarone -pimozide -quinidine -thioridazine -ziprasidone This medicine may also interact with the following medications: -antihistamines for allergy, cough and cold -atropine -bethanechol -carbamazepine -certain medicines for bladder problems like oxybutynin, tolterodine -certain medicines for Parkinson's disease like benztropine, trihexyphenidyl -certain medicines for stomach problems like dicyclomine, hyoscyamine -certain medicines for travel sickness like scopolamine -dexamethasone -ipratropium -NSAIDs, medicines for pain and inflammation, like ibuprofen or naproxen -other medicines for Alzheimer's disease -other medicines that prolong the QT interval (cause an abnormal heart rhythm) -phenobarbital -phenytoin -rifampin, rifabutin or rifapentine This list may not describe all possible interactions. Give your health care provider a list of all the medicines, herbs, non-prescription drugs, or dietary supplements you use. Also tell them if you smoke, drink alcohol, or use illegal drugs. Some items may interact with your medicine. What should I watch for while using this medicine? Visit your doctor or health care professional for regular checks on your progress. Check with your doctor or health care professional if your symptoms do not get better or if they get worse. You may get drowsy or dizzy. Do not drive, use machinery, or do anything that needs mental alertness until you know  how this drug affects you. What  side effects may I notice from receiving this medicine? Side effects that you should report to your doctor or health care professional as soon as possible: -allergic reactions like skin rash, itching or hives, swelling of the face, lips, or tongue -changes in vision -feeling faint or lightheaded, falls -problems with balance -redness, blistering, peeling or loosening of the skin, including inside the mouth -slow heartbeat, or palpitations -stomach pain -unusual bleeding or bruising, red or purple spots on the skin -vomiting -weight loss Side effects that usually do not require medical attention (report to your doctor or health care professional if they continue or are bothersome): -diarrhea, especially when starting treatment -headache -indigestion or heartburn -loss of appetite -muscle cramps -nausea This list may not describe all possible side effects. Call your doctor for medical advice about side effects. You may report side effects to FDA at 1-800-FDA-1088. Where should I keep my medicine? Keep out of reach of children. Store at room temperature between 15 and 30 degrees C (59 and 86 degrees F). Throw away any unused medicine after the expiration date. NOTE: This sheet is a summary. It may not cover all possible information. If you have questions about this medicine, talk to your doctor, pharmacist, or health care provider.    2016, [employer] Standard. (2014-03-19 07:51:52)
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
## 2015-06-30 NOTE — Progress Notes (Signed)
I have reviewed and agreed above plan. 

## 2015-07-07 ENCOUNTER — Ambulatory Visit
Admission: RE | Admit: 2015-07-07 | Discharge: 2015-07-07 | Disposition: A | Discharge status: Home / Self Care | Payer: Medicare HMO | Source: Ambulatory Visit | Attending: Adult Health | Admitting: Adult Health

## 2015-07-07 DIAGNOSIS — Z8669 Personal history of other diseases of the nervous system and sense organs: Secondary | ICD-10-CM

## 2015-07-08 ENCOUNTER — Telehealth: Payer: Self-pay

## 2015-07-08 NOTE — Telephone Encounter (Signed)
-----   Message from Ward Givens, NP sent at 07/08/2015  7:56 AM EST ----- CT stable. No significant changes since 2013. Please call the patient

## 2015-07-08 NOTE — Telephone Encounter (Signed)
Spoke to patient. Gave CT results. Patient verbalized understanding.

## 2015-07-27 ENCOUNTER — Telehealth: Payer: Self-pay | Admitting: Neurology

## 2015-07-27 NOTE — Telephone Encounter (Signed)
Patient requesting a detailed letter giving reasons why Emily Brown cannot keep Emily Brown due to her health issues and residing at an assisted living facility. Please fax letter to [facility] Fax [phone] c/o Emily Brown. Patient's  best call back is [phone].

## 2015-07-28 NOTE — Telephone Encounter (Signed)
Spoke to sister Emily Brown for more details. Sister explained pt needing a letter as described in previous note to provide for [facility] explaining her health condition.  Pt's 41yr old adopted son Emily Brown was staying with another family member in [place], but recently moved back to [place] with his blood sister, and now needs proof for school as to why he cannot reside with his adopted mother.

## 2015-07-29 ENCOUNTER — Encounter: Payer: Self-pay | Admitting: Adult Health

## 2015-07-29 NOTE — Telephone Encounter (Signed)
Spoke to sister [name] and patient. It is ok to address the letter to patient. They will come pick up the letter when it is complete.

## 2015-07-29 NOTE — Telephone Encounter (Signed)
Completed, signed and given to assistant Casandra.

## 2015-07-29 NOTE — Telephone Encounter (Signed)
Called sister. Informed letter available at front. Patient will sign medical release for [facility] when she arrives.

## 2015-07-29 NOTE — Telephone Encounter (Signed)
Please call patient and see who the letter is going to. If we have to send it to [facility] we will need a release form signed.

## 2015-09-14 ENCOUNTER — Encounter (INDEPENDENT_AMBULATORY_CARE_PROVIDER_SITE_OTHER): Payer: Medicare HMO | Admitting: Ophthalmology

## 2015-09-14 DIAGNOSIS — I10 Essential (primary) hypertension: Secondary | ICD-10-CM | POA: Diagnosis not present

## 2015-09-14 DIAGNOSIS — H353111 Nonexudative age-related macular degeneration, right eye, early dry stage: Secondary | ICD-10-CM | POA: Diagnosis not present

## 2015-09-14 DIAGNOSIS — H2513 Age-related nuclear cataract, bilateral: Secondary | ICD-10-CM | POA: Diagnosis not present

## 2015-09-14 DIAGNOSIS — H31002 Unspecified chorioretinal scars, left eye: Secondary | ICD-10-CM

## 2015-09-14 DIAGNOSIS — H353122 Nonexudative age-related macular degeneration, left eye, intermediate dry stage: Secondary | ICD-10-CM

## 2015-09-14 DIAGNOSIS — H35033 Hypertensive retinopathy, bilateral: Secondary | ICD-10-CM

## 2015-09-14 DIAGNOSIS — H43813 Vitreous degeneration, bilateral: Secondary | ICD-10-CM

## 2015-09-29 ENCOUNTER — Ambulatory Visit (INDEPENDENT_AMBULATORY_CARE_PROVIDER_SITE_OTHER): Payer: Medicare HMO | Admitting: Adult Health

## 2015-09-29 ENCOUNTER — Encounter: Payer: Self-pay | Admitting: Adult Health

## 2015-09-29 VITALS — BP 140/87 | HR 73 | Ht 63.0 in | Wt 171.0 lb

## 2015-09-29 DIAGNOSIS — R413 Other amnesia: Secondary | ICD-10-CM | POA: Diagnosis not present

## 2015-09-29 NOTE — Patient Instructions (Signed)
Memory score is stable   Memantine Tablets What is this medicine? MEMANTINE (MEM an teen) is used to treat dementia caused by Alzheimer's disease. This medicine may be used for other purposes; ask your health care provider or pharmacist if you have questions. What should I tell my health care provider before I take this medicine? They need to know if you have any of these conditions: -difficulty passing urine -kidney disease -liver disease -seizures -an unusual or allergic reaction to memantine, other medicines, foods, dyes, or preservatives -pregnant or trying to get pregnant -breast-feeding How should I use this medicine? Take this medicine by mouth with a glass of water. Follow the directions on the prescription label. You may take this medicine with or without food. Take your doses at regular intervals. Do not take your medicine more often than directed. Continue to take your medicine even if you feel better. Do not stop taking except on the advice of your doctor or health care professional. Talk to your pediatrician regarding the use of this medicine in children. Special care may be needed. Overdosage: If you think you have taken too much of this medicine contact a poison control center or emergency room at once. NOTE: This medicine is only for you. Do not share this medicine with others. What if I miss a dose? If you miss a dose, take it as soon as you can. If it is almost time for your next dose, take only that dose. Do not take double or extra doses. If you do not take your medicine for several days, contact your health care provider. Your dose may need to be changed. What may interact with this medicine? -acetazolamide -amantadine -cimetidine -dextromethorphan -dofetilide -hydrochlorothiazide -ketamine -metformin -methazolamide -quinidine -ranitidine -sodium bicarbonate -triamterene This list may not describe all possible interactions. Give your health care provider a  list of all the medicines, herbs, non-prescription drugs, or dietary supplements you use. Also tell them if you smoke, drink alcohol, or use illegal drugs. Some items may interact with your medicine. What should I watch for while using this medicine? Visit your doctor or health care professional for regular checks on your progress. Check with your doctor or health care professional if there is no improvement in your symptoms or if they get worse. You may get drowsy or dizzy. Do not drive, use machinery, or do anything that needs mental alertness until you know how this drug affects you. Do not stand or sit up quickly, especially if you are an older patient. This reduces the risk of dizzy or fainting spells. Alcohol can make you more drowsy and dizzy. Avoid alcoholic drinks. What side effects may I notice from receiving this medicine? Side effects that you should report to your doctor or health care professional as soon as possible: -allergic reactions like skin rash, itching or hives, swelling of the face, lips, or tongue -agitation or a feeling of restlessness -depressed mood -dizziness -hallucinations -redness, blistering, peeling or loosening of the skin, including inside the mouth -seizures -vomiting Side effects that usually do not require medical attention (report to your doctor or health care professional if they continue or are bothersome): -constipation -diarrhea -headache -nausea -trouble sleeping This list may not describe all possible side effects. Call your doctor for medical advice about side effects. You may report side effects to FDA at 1-800-FDA-1088. Where should I keep my medicine? Keep out of the reach of children. Store at room temperature between 15 degrees and 30 degrees C (59 degrees and  86 degrees F). Throw away any unused medicine after the expiration date. NOTE: This sheet is a summary. It may not cover all possible information. If you have questions about this  medicine, talk to your doctor, pharmacist, or health care provider.    2016, Elsevier/Gold Standard. (2013-05-26 14:10:42) Donepezil tablets What is this medicine? DONEPEZIL (doe NEP e zil) is used to treat mild to moderate dementia caused by Alzheimer's disease. This medicine may be used for other purposes; ask your health care provider or pharmacist if you have questions. What should I tell my health care provider before I take this medicine? They need to know if you have any of these conditions: -asthma or other lung disease -difficulty passing urine -head injury -heart disease -history of irregular heartbeat -liver disease -seizures (convulsions) -stomach or intestinal disease, ulcers or stomach bleeding -an unusual or allergic reaction to donepezil, other medicines, foods, dyes, or preservatives -pregnant or trying to get pregnant -breast-feeding How should I use this medicine? Take this medicine by mouth with a glass of water. Follow the directions on the prescription label. You may take this medicine with or without food. Take this medicine at regular intervals. This medicine is usually taken before bedtime. Do not take it more often than directed. Continue to take your medicine even if you feel better. Do not stop taking except on your doctor's advice. If you are taking the 23 mg donepezil tablet, swallow it whole; do not cut, crush, or chew it. Talk to your pediatrician regarding the use of this medicine in children. Special care may be needed. Overdosage: If you think you have taken too much of this medicine contact a poison control center or emergency room at once. NOTE: This medicine is only for you. Do not share this medicine with others. What if I miss a dose? If you miss a dose, take it as soon as you can. If it is almost time for your next dose, take only that dose, do not take double or extra doses. What may interact with this medicine? Do not take this medicine with  any of the following medications: -certain medicines for fungal infections like itraconazole, fluconazole, posaconazole, and voriconazole -cisapride -dextromethorphan; quinidine -dofetilide -dronedarone -pimozide -quinidine -thioridazine -ziprasidone This medicine may also interact with the following medications: -antihistamines for allergy, cough and cold -atropine -bethanechol -carbamazepine -certain medicines for bladder problems like oxybutynin, tolterodine -certain medicines for Parkinson's disease like benztropine, trihexyphenidyl -certain medicines for stomach problems like dicyclomine, hyoscyamine -certain medicines for travel sickness like scopolamine -dexamethasone -ipratropium -NSAIDs, medicines for pain and inflammation, like ibuprofen or naproxen -other medicines for Alzheimer's disease -other medicines that prolong the QT interval (cause an abnormal heart rhythm) -phenobarbital -phenytoin -rifampin, rifabutin or rifapentine This list may not describe all possible interactions. Give your health care provider a list of all the medicines, herbs, non-prescription drugs, or dietary supplements you use. Also tell them if you smoke, drink alcohol, or use illegal drugs. Some items may interact with your medicine. What should I watch for while using this medicine? Visit your doctor or health care professional for regular checks on your progress. Check with your doctor or health care professional if your symptoms do not get better or if they get worse. You may get drowsy or dizzy. Do not drive, use machinery, or do anything that needs mental alertness until you know how this drug affects you. What side effects may I notice from receiving this medicine? Side effects that you should report to your  doctor or health care professional as soon as possible: -allergic reactions like skin rash, itching or hives, swelling of the face, lips, or tongue -changes in vision -feeling faint or  lightheaded, falls -problems with balance -redness, blistering, peeling or loosening of the skin, including inside the mouth -slow heartbeat, or palpitations -stomach pain -unusual bleeding or bruising, red or purple spots on the skin -vomiting -weight loss Side effects that usually do not require medical attention (report to your doctor or health care professional if they continue or are bothersome): -diarrhea, especially when starting treatment -headache -indigestion or heartburn -loss of appetite -muscle cramps -nausea This list may not describe all possible side effects. Call your doctor for medical advice about side effects. You may report side effects to FDA at 1-800-FDA-1088. Where should I keep my medicine? Keep out of reach of children. Store at room temperature between 15 and 30 degrees C (59 and 86 degrees F). Throw away any unused medicine after the expiration date. NOTE: This sheet is a summary. It may not cover all possible information. If you have questions about this medicine, talk to your doctor, pharmacist, or health care provider.    2016, Elsevier/Gold Standard. (2014-03-19 07:51:52)
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
## 2015-09-29 NOTE — Progress Notes (Signed)
I have reviewed and agreed above plan. 

## 2016-01-12 ENCOUNTER — Other Ambulatory Visit: Payer: Self-pay

## 2016-01-12 DIAGNOSIS — Z1231 Encounter for screening mammogram for malignant neoplasm of breast: Secondary | ICD-10-CM

## 2016-03-28 ENCOUNTER — Encounter: Payer: Self-pay | Admitting: Adult Health

## 2016-03-28 ENCOUNTER — Ambulatory Visit (INDEPENDENT_AMBULATORY_CARE_PROVIDER_SITE_OTHER): Payer: Medicare HMO | Admitting: Adult Health

## 2016-03-28 VITALS — BP 118/72 | HR 82 | Ht 63.0 in | Wt 179.2 lb

## 2016-03-28 DIAGNOSIS — R413 Other amnesia: Secondary | ICD-10-CM | POA: Diagnosis not present

## 2016-03-28 NOTE — Patient Instructions (Signed)
Memory score is stable- will continue to monitor If your symptoms worsen or you develop new symptoms please let us know.
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
## 2016-04-15 NOTE — Progress Notes (Signed)
I reviewed note and agree with plan.   VIKRAM R. PENUMALLI, MD  Certified in Neurology, Neurophysiology and Neuroimaging  [facility] [address], [address] [place], [place] [address] [phone]   

## 2016-05-23 ENCOUNTER — Other Ambulatory Visit: Payer: Self-pay | Admitting: Family Medicine

## 2016-05-23 DIAGNOSIS — Z1231 Encounter for screening mammogram for malignant neoplasm of breast: Secondary | ICD-10-CM

## 2016-05-30 ENCOUNTER — Telehealth: Payer: Self-pay | Admitting: *Deleted

## 2016-05-30 ENCOUNTER — Encounter (HOSPITAL_COMMUNITY): Payer: Self-pay

## 2016-05-30 ENCOUNTER — Emergency Department (HOSPITAL_COMMUNITY)
Admission: EM | Admit: 2016-05-30 | Discharge: 2016-05-30 | Disposition: A | Discharge status: Home / Self Care | Payer: Medicare HMO | Attending: Emergency Medicine | Admitting: Emergency Medicine

## 2016-05-30 ENCOUNTER — Emergency Department (HOSPITAL_COMMUNITY): Payer: Medicare HMO

## 2016-05-30 DIAGNOSIS — R0602 Shortness of breath: Secondary | ICD-10-CM | POA: Diagnosis present

## 2016-05-30 DIAGNOSIS — I10 Essential (primary) hypertension: Secondary | ICD-10-CM | POA: Insufficient documentation

## 2016-05-30 DIAGNOSIS — J180 Bronchopneumonia, unspecified organism: Secondary | ICD-10-CM | POA: Diagnosis not present

## 2016-05-30 LAB — CBC WITH DIFFERENTIAL/PLATELET
Basophils Absolute: 0 K/uL (ref 0.0–0.1)
Basophils Relative: 0 %
Eosinophils Absolute: 0.2 K/uL (ref 0.0–0.7)
Eosinophils Relative: 3 %
HCT: 38.1 % (ref 36.0–46.0)
Hemoglobin: 12.1 g/dL (ref 12.0–15.0)
Lymphocytes Relative: 36 %
Lymphs Abs: 2.9 K/uL (ref 0.7–4.0)
MCH: 25.4 pg — ABNORMAL LOW (ref 26.0–34.0)
MCHC: 31.8 g/dL (ref 30.0–36.0)
MCV: 80 fL (ref 78.0–100.0)
Monocytes Absolute: 0.5 K/uL (ref 0.1–1.0)
Monocytes Relative: 6 %
Neutro Abs: 4.4 K/uL (ref 1.7–7.7)
Neutrophils Relative %: 55 %
Platelets: 328 K/uL (ref 150–400)
RBC: 4.76 MIL/uL (ref 3.87–5.11)
RDW: 13.6 % (ref 11.5–15.5)
WBC: 8 K/uL (ref 4.0–10.5)

## 2016-05-30 LAB — BASIC METABOLIC PANEL WITH GFR
Anion gap: 7 (ref 5–15)
BUN: 10 mg/dL (ref 6–20)
CO2: 27 mmol/L (ref 22–32)
Calcium: 9.9 mg/dL (ref 8.9–10.3)
Chloride: 108 mmol/L (ref 101–111)
Creatinine, Ser: 0.82 mg/dL (ref 0.44–1.00)
GFR calc Af Amer: >60 mL/min
GFR calc non Af Amer: >60 mL/min
Glucose, Bld: 98 mg/dL (ref 65–99)
Potassium: 3.8 mmol/L (ref 3.5–5.1)
Sodium: 142 mmol/L (ref 135–145)

## 2016-05-30 MED ORDER — AZITHROMYCIN 250 MG PO TABS: 250.0000 mg | ORAL_TABLET | Freq: Every day | ORAL | 0 refills | Status: DC

## 2016-05-30 NOTE — Telephone Encounter (Signed)
An addendum was placed in my note.

## 2016-05-30 NOTE — ED Triage Notes (Signed)
Pt. Having sob with anxiety beginning this morning. The symptoms have intermittent all day.  Pt. Denies any chest pain.  Pt. Has a dry cough.  Skin is warm and dry. NSR on the monitor BP 146/96 , 97% Ra.

## 2016-05-30 NOTE — Discharge Instructions (Signed)
Take antibiotics as prescribed. You possibly have early bronchitis. I still think your chronic cough may be related to your Lisinopril. Please follow up with your primary care provider for medication adjustment. Return to the ED if you experience severe worsening of your symptoms, difficulty breathing, fever or chills.

## 2016-05-30 NOTE — ED Notes (Signed)
Patient Alert and oriented X4. Stable and ambulatory. Patient verbalized understanding of the discharge instructions.  Patient belongings were taken by the patient.  

## 2016-05-30 NOTE — Progress Notes (Signed)
Emily Brown called to state that the patient does require assistance. She is not doing her finances or driving. She has difficulty preparing meals. We can reassess at the next office visit.

## 2016-05-30 NOTE — Telephone Encounter (Signed)
Spoke to [employer] (sister on HIPPA) - states her sister has had legal custody of her 68 year old great-nephew since he was an infant. [name] says the patient is no longer driving or handling her own finances.  She also has difficulty preparing meals.  Stress makes her symptoms worse.  Her great-nephew is now living [place]of[place] with his older sister who is seeking to legally adopt him.  [name] states the patient is agreeable with this plan.  She needs a letter for the custody hearing simply stating her current neurological condition. [name] would like a call back when this letter is ready for pick up.

## 2016-05-30 NOTE — ED Provider Notes (Addendum)
Medical screening examination/treatment/procedure(s) were conducted as a shared visit with non-physician practitioner(s) and myself.  I personally evaluated the patient during the encounter.   EKG Interpretation  Date/Time:  Tuesday May 30 2016 17:28:48 EDT Ventricular Rate:  84 PR Interval:    QRS Duration: 77 QT Interval:  365 QTC Calculation: 432 R Axis:   51 Text Interpretation:  Sinus rhythm No significant change since last tracing Confirmed by Tersea Aulds  MD, Hansika Leaming [phone]) on 05/30/2016 5:34:27 PM      Patient seen by me along with the physician assistant. Patient sent in from nursing facility or independent living facility for coughing episode where she got short of breath. Patient has a history of chronic cough. Patient satting 100% on room air here. Does have a dry cough. We will get chest x-ray to make sure it's okay patient feels back to baseline currently. Lungs are clear bilaterally no wheezing no rales.  Patient is on Lisinopril and this can be responsible for the chronic cough.    Fredia Sorrow, MD 05/30/16 1753    Fredia Sorrow, MD 05/30/16 1755

## 2016-05-30 NOTE — ED Provider Notes (Signed)
[facility] DEPT Provider Note   CSN: [id number] Arrival date & time: 05/30/16  1651     History   Chief Complaint Chief Complaint  Patient presents with  . Shortness of Breath  . Anxiety    HPI Emily Brown is a 70 y.o. female with a past medical history of mild dementia, HTN, anxiety, HLD who presents to the ED today complaining of cough and shortness of breath. Patient and family member who is at bedside states the patient has been having a dry cough daily for over a year now. Today while at independent living facility patient had a "coughing fit". Per the nursing staff at the living facility patient had shortness of breath during the coughing fits called EMS. Patient currently asymptomatic. She denies any chest pain, fevers. Patient takes lisinopril for blood pressure. Denies history of GERD.  HPI  Past Medical History:  Diagnosis Date  . Hypertension   . IBS (irritable bowel syndrome)   . Memory loss     Patient Active Problem List   Diagnosis Date Noted  . Memory loss   . IBS (irritable bowel syndrome)   . Hydrocephalus 02/07/2013  . Chronic headaches 02/07/2013  . ABDOMINAL CRAMPS 06/28/2010  . DEMENTIA, MILD 04/27/2010  . CATARACTS, BILATERAL 06/04/2008  . CERUMEN IMPACTION, RIGHT 01/28/2008  . ANXIETY STATE NOS 03/26/2007  . HYPERTENSION, BENIGN ESSENTIAL 03/26/2007  . PALPITATIONS 03/26/2007  . HEMORRHOIDS, INTERNAL 03/13/2007  . DIVERTICULOSIS, COLON W/O HEM 03/13/2007  . HYPERCHOLESTEROLEMIA, MILD 12/10/2006  . NEOP, NOS, BREAST 08/22/1975    Past Surgical History:  Procedure Laterality Date  . left breast tumor removal    . PARTIAL HYSTERECTOMY      OB History    No data available       Home Medications    Prior to Admission medications   Medication Sig Start Date End Date Taking? Authorizing Provider  dicyclomine (BENTYL) 20 MG tablet Take 1 tablet (20 mg total) by mouth 2 (two) times daily as needed (abdominal cramps). 06/29/13    Julianne Rice, MD  fluticasone (FLONASE) 50 MCG/ACT nasal spray Place 1 spray into both nostrils daily as needed for allergies or rhinitis.    Historical Provider, MD  lisinopril (PRINIVIL,ZESTRIL) 20 MG tablet Take 20 mg by mouth daily.    Historical Provider, MD  lisinopril-hydrochlorothiazide (PRINZIDE,ZESTORETIC) 20-12.5 MG tablet Take by mouth. 10/22/15 10/21/16  Historical Provider, MD  Multiple Vitamins-Minerals (PRESERVISION AREDS PO) Take by mouth.    Historical Provider, MD  Polyethyl Glycol-Propyl Glycol (SYSTANE OP) Apply to eye.    Historical Provider, MD  polyethylene glycol (MIRALAX) packet Take 17 g by mouth daily. 12/06/14   Fransico Meadow, PA-C    Family History Family History  Problem Relation Age of Onset  . Diabetes Mother   . Kidney disease Father     Social History Social History  Substance Use Topics  . Smoking status: Never Smoker  . Smokeless tobacco: Never Used  . Alcohol use No     Allergies   Amoxicillin; Ceftin [cefuroxime axetil]; and Penicillins   Review of Systems Review of Systems  All other systems reviewed and are negative.    Physical Exam Updated Vital Signs BP 140/62 (BP Location: Right Arm)   Pulse 85   Temp 98.8 F (37.1 C) (Oral)   Resp (!) 28   Ht 5\' 2"  (1.575 m)   Wt 81.2 kg   SpO2 98%   BMI 32.74 kg/m   Physical Exam  Constitutional:  She is oriented to person, place, and time. She appears well-developed and well-nourished. No distress.  HENT:  Head: Normocephalic and atraumatic.  Mouth/Throat: No oropharyngeal exudate.  Eyes: Conjunctivae and EOM are normal. Pupils are equal, round, and reactive to light. Right eye exhibits no discharge. Left eye exhibits no discharge. No scleral icterus.  Cardiovascular: Normal rate, regular rhythm, normal heart sounds and intact distal pulses.  Exam reveals no gallop and no friction rub.   No murmur heard. Pulmonary/Chest: Effort normal and breath sounds normal. No respiratory  distress. She has no wheezes. She has no rales. She exhibits no tenderness.  Abdominal: Soft. She exhibits no distension. There is no tenderness. There is no guarding.  Musculoskeletal: Normal range of motion. She exhibits no edema.  Neurological: She is alert and oriented to person, place, and time. No cranial nerve deficit.  Strength 5/5 throughout. No sensory deficits.   Skin: Skin is warm and dry. No rash noted. She is not diaphoretic. No erythema. No pallor.  Psychiatric: She has a normal mood and affect. Her behavior is normal.  Nursing note and vitals reviewed.    ED Treatments / Results  Labs (all labs ordered are listed, but only abnormal results are displayed) Labs Reviewed  CBC WITH DIFFERENTIAL/PLATELET - Abnormal; Notable for the following:       Result Value   MCH 25.4 (*)    All other components within normal limits  BASIC METABOLIC PANEL    EKG  EKG Interpretation  Date/Time:  Tuesday May 30 2016 17:28:48 EDT Ventricular Rate:  84 PR Interval:    QRS Duration: 77 QT Interval:  365 QTC Calculation: 432 R Axis:   51 Text Interpretation:  Sinus rhythm No significant change since last tracing Confirmed by ZACKOWSKI  MD, [place] [phone]) on 05/30/2016 5:34:27 PM       Radiology Dg Chest 2 View  Result Date: 05/30/2016 CLINICAL DATA:  Cough.  Shortness of breath. EXAM: CHEST  2 VIEW COMPARISON:  12/05/2014 FINDINGS: Cardiac and mediastinal margins appear normal. Best seen on the lateral projection there linear opacities in the left lower lobe which could reflect subsegmental atelectasis or early bronchopneumonia. No pleural effusion. IMPRESSION: 1. Left lower lobe subsegmental atelectasis versus early bronchopneumonia. Electronically Signed   By: Van Clines M.D.   On: 05/30/2016 18:23    Procedures Procedures (including critical care time)  Medications Ordered in ED Medications - No data to display   Initial Impression / Assessment and Plan / ED  Course  I have reviewed the triage vital signs and the nursing notes.  Pertinent labs & imaging results that were available during my care of the patient were reviewed by me and considered in my medical decision making (see chart for details).  Clinical Course    70 year old female with history of mild dementia, HTN presents to the ED today complaining of a "coughing fit" that occurred today. Patient x-ray shortness of breath during this coughing fit. She states that she has had a chronic cough for over one year. Presentation to ED patient appears well. Lungs clear to auscultation bilaterally. Vital signs are stable. She is saturating 100% on room air. Patient does take lisinopril, suspect this is likely etiology of chronic cough. No leukocytosis today. Chest x-ray does show some left lower lobe atelectasis versus early bronchopneumonia. Given patient age will treat with azithromycin to cover for atypicals. Recommend follow-up with PCP tomorrow for reevaluation and medication adjustment. Return precautions outlined in patient discharge instructions.  Patient  was discussed with and seen by Dr. Rogene Houston who agrees with the treatment plan.    Final Clinical Impressions(s) / ED Diagnoses   Final diagnoses:  Bronchopneumonia    New Prescriptions New Prescriptions   No medications on file     Carlos Levering, PA-C 05/31/16 1654    Fredia Sorrow, MD 06/02/16 0730

## 2016-05-31 ENCOUNTER — Encounter: Payer: Self-pay | Admitting: *Deleted

## 2016-05-31 ENCOUNTER — Ambulatory Visit
Admission: RE | Admit: 2016-05-31 | Discharge: 2016-05-31 | Disposition: A | Discharge status: Home / Self Care | Payer: Medicare HMO | Source: Ambulatory Visit | Attending: Family Medicine | Admitting: Family Medicine

## 2016-05-31 DIAGNOSIS — Z1231 Encounter for screening mammogram for malignant neoplasm of breast: Secondary | ICD-10-CM

## 2016-05-31 NOTE — Telephone Encounter (Signed)
Note printed to MM/NP to sign.

## 2016-06-01 NOTE — Telephone Encounter (Signed)
Spoke to [employer], and letter placed up front for pick up.  She verbalized understanding.

## 2016-06-04 ENCOUNTER — Emergency Department (HOSPITAL_COMMUNITY)
Admission: EM | Admit: 2016-06-04 | Discharge: 2016-06-05 | Disposition: A | Discharge status: Home / Self Care | Payer: Medicare HMO | Attending: Emergency Medicine | Admitting: Emergency Medicine

## 2016-06-04 ENCOUNTER — Emergency Department (HOSPITAL_COMMUNITY): Payer: Medicare HMO

## 2016-06-04 ENCOUNTER — Encounter (HOSPITAL_COMMUNITY): Payer: Self-pay | Admitting: Emergency Medicine

## 2016-06-04 DIAGNOSIS — J40 Bronchitis, not specified as acute or chronic: Secondary | ICD-10-CM

## 2016-06-04 DIAGNOSIS — I10 Essential (primary) hypertension: Secondary | ICD-10-CM | POA: Diagnosis not present

## 2016-06-04 DIAGNOSIS — R05 Cough: Secondary | ICD-10-CM | POA: Diagnosis present

## 2016-06-04 LAB — BASIC METABOLIC PANEL
ANION GAP: 9 (ref 5–15)
BUN: 7 mg/dL (ref 6–20)
CO2: 26 mmol/L (ref 22–32)
Calcium: 10.1 mg/dL (ref 8.9–10.3)
Chloride: 105 mmol/L (ref 101–111)
Creatinine, Ser: 0.83 mg/dL (ref 0.44–1.00)
Glucose, Bld: 95 mg/dL (ref 65–99)
POTASSIUM: 3.6 mmol/L (ref 3.5–5.1)
SODIUM: 140 mmol/L (ref 135–145)

## 2016-06-04 LAB — CBC
HEMATOCRIT: 39 % (ref 36.0–46.0)
HEMOGLOBIN: 12.7 g/dL (ref 12.0–15.0)
MCH: 25.9 pg — ABNORMAL LOW (ref 26.0–34.0)
MCHC: 32.6 g/dL (ref 30.0–36.0)
MCV: 79.4 fL (ref 78.0–100.0)
Platelets: 341 10*3/uL (ref 150–400)
RBC: 4.91 MIL/uL (ref 3.87–5.11)
RDW: 13.5 % (ref 11.5–15.5)
WBC: 7.6 10*3/uL (ref 4.0–10.5)

## 2016-06-04 LAB — I-STAT TROPONIN, ED: Troponin i, poc: 0.01 ng/mL (ref 0.00–0.08)

## 2016-06-04 MED ORDER — AZITHROMYCIN 250 MG PO TABS: 250.0000 mg | ORAL_TABLET | Freq: Every day | ORAL | 0 refills | Status: DC

## 2016-06-04 MED ORDER — IPRATROPIUM-ALBUTEROL 0.5-2.5 (3) MG/3ML IN SOLN
3.0000 mL | Freq: Once | RESPIRATORY_TRACT | Status: AC
  Administered 2016-06-04: 3 mL via RESPIRATORY_TRACT
  Filled 2016-06-04: qty 3

## 2016-06-04 MED ORDER — IOPAMIDOL (ISOVUE-370) INJECTION 76%
INTRAVENOUS | Status: AC
  Administered 2016-06-04: 80 mL
  Filled 2016-06-04: qty 100

## 2016-06-04 MED ORDER — ALBUTEROL SULFATE HFA 108 (90 BASE) MCG/ACT IN AERS: 1.0000 | INHALATION_SPRAY | Freq: Four times a day (QID) | RESPIRATORY_TRACT | 0 refills | Status: DC | PRN

## 2016-06-04 NOTE — ED Triage Notes (Addendum)
Pt present here with sob that is no better since seen here on 10/10 for same. Pt reports dry cough, denies any chest pain or other pain. Pt is warm and dry. Pt has clear lung sounds able to speak in complete sentences. Pt appears anxious

## 2016-06-04 NOTE — ED Notes (Signed)
Patient transported to CT 

## 2016-06-04 NOTE — ED Provider Notes (Signed)
[facility] DEPT Provider Note   CSN: [id number] Arrival date & time: 06/04/16  1223     History   Chief Complaint Chief Complaint  Patient presents with  . Shortness of Breath    HPI Emily Brown is a 70 y.o. female hx of HTN, IBS, dementia, Who presented with cough, shortness of breath. She lives in an independent living facility. She came to the ER 5 days ago with a cough 5 days ago. She had a negative chest x-ray and labs at that time was sent home. Today she was at church and then suddenly has some shortness of breath and was breathing very fast. She does have a chronic cough and was thought to be from lisinopril. Was noted to be anxious in triage.   The history is provided by the patient.  Level V caveat- dementia   Past Medical History:  Diagnosis Date  . Hypertension   . IBS (irritable bowel syndrome)   . Memory loss     Patient Active Problem List   Diagnosis Date Noted  . Memory loss   . IBS (irritable bowel syndrome)   . Hydrocephalus 02/07/2013  . Chronic headaches 02/07/2013  . ABDOMINAL CRAMPS 06/28/2010  . DEMENTIA, MILD 04/27/2010  . CATARACTS, BILATERAL 06/04/2008  . CERUMEN IMPACTION, RIGHT 01/28/2008  . ANXIETY STATE NOS 03/26/2007  . HYPERTENSION, BENIGN ESSENTIAL 03/26/2007  . PALPITATIONS 03/26/2007  . HEMORRHOIDS, INTERNAL 03/13/2007  . DIVERTICULOSIS, COLON W/O HEM 03/13/2007  . HYPERCHOLESTEROLEMIA, MILD 12/10/2006  . NEOP, NOS, BREAST 08/22/1975    Past Surgical History:  Procedure Laterality Date  . left breast tumor removal    . PARTIAL HYSTERECTOMY      OB History    No data available       Home Medications    Prior to Admission medications   Medication Sig Start Date End Date Taking? Authorizing Provider  dicyclomine (BENTYL) 20 MG tablet Take 1 tablet (20 mg total) by mouth 2 (two) times daily as needed (abdominal cramps). 06/29/13  Yes Julianne Rice, MD  fexofenadine (ALLEGRA) 180 MG tablet Take 180 mg by mouth  daily. 04/19/16 04/19/17 Yes Historical Provider, MD  fluticasone (FLONASE) 50 MCG/ACT nasal spray Place 1 spray into both nostrils daily as needed for allergies or rhinitis.   Yes Historical Provider, MD  hydrochlorothiazide (HYDRODIURIL) 25 MG tablet Take 25 mg by mouth daily. 04/25/16  Yes Historical Provider, MD  lisinopril (PRINIVIL,ZESTRIL) 20 MG tablet Take 20 mg by mouth daily.   Yes Historical Provider, MD  Multiple Vitamins-Minerals (PRESERVISION AREDS PO) Take 1 capsule by mouth daily.    Yes Historical Provider, MD  neomycin-polymyxin b-dexamethasone (MAXITROL) 3.5-10000-0.1 SUSP Place 1 drop into both eyes 3 (three) times daily. 05/24/16  Yes Historical Provider, MD  Polyethyl Glycol-Propyl Glycol (SYSTANE) 0.4-0.3 % GEL ophthalmic gel Place 1 application into both eyes 2 (two) times daily.   Yes Historical Provider, MD  polyethylene glycol (MIRALAX) packet Take 17 g by mouth daily. Patient taking differently: Take 17 g by mouth daily as needed for severe constipation.  12/06/14  Yes Hollace Kinnier Sofia, PA-C  azithromycin (ZITHROMAX) 250 MG tablet Take 1 tablet (250 mg total) by mouth daily. Take first 2 tablets together, then 1 every day until finished. Patient not taking: Reported on 06/04/2016 05/30/16   Carlos Levering, PA-C    Family History Family History  Problem Relation Age of Onset  . Diabetes Mother   . Kidney disease Father     Social  History Social History  Substance Use Topics  . Smoking status: Never Smoker  . Smokeless tobacco: Never Used  . Alcohol use No     Allergies   Amoxicillin; Ceftin [cefuroxime axetil]; and Penicillins   Review of Systems Review of Systems  Respiratory: Positive for cough and shortness of breath.   All other systems reviewed and are negative.    Physical Exam Updated Vital Signs BP 153/85   Pulse 94   Temp 98.8 F (37.1 C) (Oral)   Resp 17   Ht 5\' 2"  (1.575 m)   Wt 179 lb (81.2 kg)   SpO2 94%   BMI 32.74 kg/m    Physical Exam  Constitutional: She is oriented to person, place, and time. She appears well-developed and well-nourished.  HENT:  Head: Normocephalic.  Eyes: EOM are normal. Pupils are equal, round, and reactive to light.  Neck: Normal range of motion. Neck supple.  Cardiovascular: Normal rate, regular rhythm and normal heart sounds.   Pulmonary/Chest:  Tachypneic, diminished bilateral bases, no obvious wheezing   Abdominal: Soft. Bowel sounds are normal. She exhibits no distension. There is no tenderness. There is no guarding.  Musculoskeletal: Normal range of motion.  Neurological: She is alert and oriented to person, place, and time.  Skin: Skin is warm.  Psychiatric: She has a normal mood and affect.  Nursing note and vitals reviewed.    ED Treatments / Results  Labs (all labs ordered are listed, but only abnormal results are displayed) Labs Reviewed  CBC - Abnormal; Notable for the following:       Result Value   MCH 25.9 (*)    All other components within normal limits  BASIC METABOLIC PANEL  I-STAT TROPOININ, ED    EKG  EKG Interpretation  Date/Time:  Sunday June 04 2016 12:28:12 EDT Ventricular Rate:  81 PR Interval:  144 QRS Duration: 70 QT Interval:  370 QTC Calculation: 429 R Axis:   60 Text Interpretation:  Normal sinus rhythm Normal ECG No significant change since last tracing Confirmed by Essie Lagunes  MD, Liya Strollo ([id number]) on 06/04/2016 6:39:34 PM       Radiology Dg Chest 2 View  Result Date: 06/04/2016 CLINICAL DATA:  Nonproductive cough with shortness of breath. EXAM: CHEST  2 VIEW COMPARISON:  05/30/2016 FINDINGS: The heart size and mediastinal contours are within normal limits. Both lungs are clear. The visualized skeletal structures are unremarkable. IMPRESSION: No active cardiopulmonary disease. Electronically Signed   By: Misty Stanley M.D.   On: 06/04/2016 13:50   Ct Angio Chest Pe W And/or Wo Contrast  Result Date: 06/04/2016 CLINICAL DATA:   Initial evaluation for intermittent shortness of breath for several months. History of hypertension. EXAM: CT ANGIOGRAPHY CHEST WITH CONTRAST TECHNIQUE: Multidetector CT imaging of the chest was performed using the standard protocol during bolus administration of intravenous contrast. Multiplanar CT image reconstructions and MIPs were obtained to evaluate the vascular anatomy. CONTRAST:  80 cc of Isovue 370. COMPARISON:  Prior radiograph from earlier the same day. FINDINGS: Cardiovascular: Intrathoracic aorta of normal caliber and appearance without acute abnormality or evidence for aneurysm. Visualized great vessels within normal limits. Heart size within normal limits. No pericardial effusion. Pulmonary arterial tree adequately opacified for evaluation. Main pulmonary artery within normal limits for size measuring 2.1 cm in diameter. No filling defect to suggest acute pulmonary embolism. Re-formatted imaging confirms these findings. Mediastinum/Nodes: 12 mm hypodense left thyroid nodule noted, of doubtful clinical significance. Additional heterogeneous enhancing 11 mm and nodule  noted within the adjacent left thyroid lobe, also of doubtful clinical significance. Thyroid otherwise unremarkable. No pathologically enlarged mediastinal, hilar, or axillary lymph nodes identified. Small hiatal hernia noted. Esophagus within normal limits. Lungs/Pleura: Scattered deep tendon atelectasis present within the bilateral lower lobes. Atelectatic changes present within the right middle lobe and lingula as well. No focal infiltrates. No pulmonary edema or pleural effusion. No pneumothorax. No worrisome pulmonary nodule or mass. Upper Abdomen: Visualized upper abdomen within normal limits. Musculoskeletal: No acute osseous abnormality. No worrisome lytic or blastic osseous lesions. Review of the MIP images confirms the above findings. IMPRESSION: 1. No CT evidence for acute pulmonary embolism. 2. Scattered dependent atelectasis  within the bilateral lower lobes, with additional atelectatic changes within the right middle lobe and lingula. 3. No other active cardiopulmonary disease identified. Electronically Signed   By: Jeannine Boga M.D.   On: 06/04/2016 21:14    Procedures Procedures (including critical care time)  Medications Ordered in ED Medications  ipratropium-albuterol (DUONEB) 0.5-2.5 (3) MG/3ML nebulizer solution 3 mL (3 mLs Nebulization Given 06/04/16 1822)  iopamidol (ISOVUE-370) 76 % injection (80 mLs  Contrast Given 06/04/16 1956)     Initial Impression / Assessment and Plan / ED Course  I have reviewed the triage vital signs and the nursing notes.  Pertinent labs & imaging results that were available during my care of the patient were reviewed by me and considered in my medical decision making (see chart for details).  Clinical Course   Emily Brown is a 70 y.o. female here with cough, shortness of breath. Patient tachypneic and borderline tachy. Consider anxiety vs bronchitis vs PE. Will get labs, CT angio. Will give nebs and reassess.    9:46 PM CT showed atelectasis bilateral bases and R middle lobe atelectasis. WBC nl. Not febrile. Well appearing. Lungs more clear after neb. Has minimal wheezing R base now. Likely bronchitis. Will dc home with albuterol, zpack.   Final Clinical Impressions(s) / ED Diagnoses   Final diagnoses:  None    New Prescriptions New Prescriptions   No medications on file     Drenda Freeze, MD 06/04/16 2147

## 2016-06-04 NOTE — Discharge Instructions (Signed)
Take zpack as prescribed.   Use albuterol every 4-6 hrs for cough or wheezing.   See your doctor  Return to ER if you have trouble breathing, wheezing, cough, fever.

## 2016-06-04 NOTE — ED Notes (Signed)
Pt was here Tuesday and dx with acute bronchitis.

## 2016-07-06 ENCOUNTER — Encounter (HOSPITAL_COMMUNITY): Payer: Self-pay | Admitting: Emergency Medicine

## 2016-07-06 ENCOUNTER — Ambulatory Visit (HOSPITAL_COMMUNITY)
Admission: EM | Admit: 2016-07-06 | Discharge: 2016-07-06 | Disposition: A | Discharge status: Home / Self Care | Payer: Medicare HMO | Attending: Emergency Medicine | Admitting: Emergency Medicine

## 2016-07-06 ENCOUNTER — Ambulatory Visit (INDEPENDENT_AMBULATORY_CARE_PROVIDER_SITE_OTHER): Payer: Medicare HMO

## 2016-07-06 DIAGNOSIS — R0609 Other forms of dyspnea: Secondary | ICD-10-CM | POA: Diagnosis not present

## 2016-07-06 DIAGNOSIS — R0600 Dyspnea, unspecified: Secondary | ICD-10-CM

## 2016-07-06 MED ORDER — DOXYCYCLINE HYCLATE 100 MG PO CAPS: 100.0000 mg | ORAL_CAPSULE | Freq: Two times a day (BID) | ORAL | 0 refills | Status: DC

## 2016-07-06 MED ORDER — PREDNISONE 10 MG PO TABS: ORAL_TABLET | ORAL | 0 refills | Status: DC

## 2016-07-06 NOTE — ED Notes (Signed)
Pt was escorted to her car via wheelchair.  Pt was very SOB.  Pt and family member instructed to call EMS if her breathing gets worse.  Pt and family also instructed to see PCP ASAP for f/u for her SOB.  Pt and family stated understanding.

## 2016-07-06 NOTE — ED Triage Notes (Signed)
C/o SOB onset 08/17  Reports she has seen her PCP and has been treated antibiotics   Sister reports sx increase w/exertion  Sx also include dry cough  A&O x4... NAD

## 2016-07-06 NOTE — ED Notes (Signed)
Per instructions walked pt down hall while attached to 02 sat. Reported to pa that sat dropped from 100% to 90-89 % both ways down hall

## 2016-07-06 NOTE — ED Provider Notes (Signed)
CSN: [id number]     Arrival date & time 07/06/16  1318 History   None    Chief Complaint  Patient presents with  . Shortness of Breath   (Consider location/radiation/quality/duration/timing/severity/associated sxs/prior Treatment) The history is provided by the patient. No language interpreter was used.  Shortness of Breath  Severity:  Severe Onset quality:  Gradual Duration:  3 months Timing:  Constant Progression:  Worsening Chronicity:  New Relieved by:  Nothing Worsened by:  Nothing Ineffective treatments:  Inhaler Associated symptoms: cough   Risk factors: no hx of PE/DVT    Patient and family member report pt has had increasing shortness of breath since August.  Pt has seen primary care MD and has been seen in the ED for the same.  Pt had a ct that showed no evidence of PE.  Family reports no fever, no chills,  Pt denies any chest pain.  No pain with exertion. Past Medical History:  Diagnosis Date  . Hypertension   . IBS (irritable bowel syndrome)   . Memory loss    Past Surgical History:  Procedure Laterality Date  . left breast tumor removal    . PARTIAL HYSTERECTOMY     Family History  Problem Relation Age of Onset  . Diabetes Mother   . Kidney disease Father    Social History  Substance Use Topics  . Smoking status: Never Smoker  . Smokeless tobacco: Never Used  . Alcohol use No   OB History    No data available     Review of Systems  Respiratory: Positive for cough and shortness of breath.   All other systems reviewed and are negative.   Allergies  Amoxicillin; Ceftin [cefuroxime axetil]; and Penicillins  Home Medications   Prior to Admission medications   Medication Sig Start Date End Date Taking? Authorizing Provider  albuterol (PROVENTIL HFA;VENTOLIN HFA) 108 (90 Base) MCG/ACT inhaler Inhale 1-2 puffs into the lungs every 6 (six) hours as needed for wheezing or shortness of breath. 06/04/16  Yes Drenda Freeze, MD  azithromycin  (ZITHROMAX) 250 MG tablet Take 1 tablet (250 mg total) by mouth daily. Take first 2 tablets together, then 1 every day until finished. 06/04/16  Yes Drenda Freeze, MD  dicyclomine (BENTYL) 20 MG tablet Take 1 tablet (20 mg total) by mouth 2 (two) times daily as needed (abdominal cramps). 06/29/13  Yes Julianne Rice, MD  fexofenadine (ALLEGRA) 180 MG tablet Take 180 mg by mouth daily. 04/19/16 04/19/17 Yes Historical Provider, MD  hydrochlorothiazide (HYDRODIURIL) 25 MG tablet Take 25 mg by mouth daily. 04/25/16  Yes Historical Provider, MD  neomycin-polymyxin b-dexamethasone (MAXITROL) 3.5-10000-0.1 SUSP Place 1 drop into both eyes 3 (three) times daily. 05/24/16  Yes Historical Provider, MD  Polyethyl Glycol-Propyl Glycol (SYSTANE) 0.4-0.3 % GEL ophthalmic gel Place 1 application into both eyes 2 (two) times daily.   Yes Historical Provider, MD  doxycycline (VIBRAMYCIN) 100 MG capsule Take 1 capsule (100 mg total) by mouth 2 (two) times daily. 07/06/16   Fransico Meadow, PA-C  fluticasone [facility]) 50 MCG/ACT nasal spray Place 1 spray into both nostrils daily as needed for allergies or rhinitis.    Historical Provider, MD  lisinopril (PRINIVIL,ZESTRIL) 20 MG tablet Take 20 mg by mouth daily.    Historical Provider, MD  Multiple Vitamins-Minerals (PRESERVISION AREDS PO) Take 1 capsule by mouth daily.     Historical Provider, MD  polyethylene glycol (MIRALAX) packet Take 17 g by mouth daily. Patient taking differently: Take  17 g by mouth daily as needed for severe constipation.  12/06/14   Fransico Meadow, PA-C  predniSONE (DELTASONE) 10 MG tablet [id number] 07/06/16   Fransico Meadow, PA-C   Meds Ordered and Administered this Visit  Medications - No data to display  BP 114/68 (BP Location: Left Arm)   Pulse 80   Temp 97.8 F (36.6 C) (Oral)   Resp 26   SpO2 100%  No data found.   Physical Exam  Constitutional: She is oriented to person, place, and time. She appears well-developed and  well-nourished.  HENT:  Head: Normocephalic.  Right Ear: External ear normal.  Left Ear: External ear normal.  Nose: Nose normal.  Mouth/Throat: Oropharynx is clear and moist.  Eyes: EOM are normal. Pupils are equal, round, and reactive to light.  Neck: Normal range of motion. Neck supple.  Cardiovascular: Normal rate and regular rhythm.   Pulmonary/Chest: Effort normal and breath sounds normal.  Abdominal: Soft.  Musculoskeletal: Normal range of motion.  Neurological: She is alert and oriented to person, place, and time.  Skin: Skin is warm.  Psychiatric: She has a normal mood and affect.    Urgent Care Course   Clinical Course     Procedures (including critical care time)  Labs Review Labs Reviewed - No data to display  Imaging Review Dg Chest 2 View  Result Date: 07/06/2016 CLINICAL DATA:  Shortness of breath for 2 weeks EXAM: CHEST  2 VIEW COMPARISON:  Chest radiograph June 04, 2016 and chest CT June 04, 2016. FINDINGS: There is no edema or consolidation. Heart size and pulmonary vascularity are normal. No adenopathy. No bone lesions. IMPRESSION: No edema or consolidation. Electronically Signed   By: Lowella Grip III M.D.   On: 07/06/2016 15:44     Visual Acuity Review  Right Eye Distance:   Left Eye Distance:   Bilateral Distance:    Right Eye Near:   Left Eye Near:    Bilateral Near:         MDM Chest xray is normal  EKg is normal.  Pt and family advised to see Dr. Jonni Sanger for recheck. Consider seeing cardiologist for more extensive evaluation/ possible stress test.    1. Dyspnea on exertion   2. Dyspnea, unspecified type    Meds ordered this encounter  Medications  . doxycycline (VIBRAMYCIN) 100 MG capsule    Sig: Take 1 capsule (100 mg total) by mouth 2 (two) times daily.    Dispense:  20 capsule    Refill:  0    Order Specific Question:   Supervising Provider    Answer:   Melony Overly [id number]  . predniSONE (DELTASONE) 10 MG tablet     Sig: 6,5,4,3,2,1    Dispense:  21 tablet    Refill:  0    Order Specific Question:   Supervising Provider    Answer:   Melony Overly [phone]   An After Visit Summary was printed and given to the patient.   Fransico Meadow, PA-C 07/06/16 [place], PA-C 07/06/16 [phone]

## 2016-07-10 NOTE — ED Notes (Signed)
On 07/10/16, Emily Brown gave verbal consent over the phone for a copy of her EKG to be faxed to a [facility] facility for a follow up appt.  On 07/11/16.

## 2017-02-16 ENCOUNTER — Other Ambulatory Visit: Payer: Self-pay | Admitting: Family Medicine

## 2017-02-16 DIAGNOSIS — Z1231 Encounter for screening mammogram for malignant neoplasm of breast: Secondary | ICD-10-CM

## 2017-03-28 ENCOUNTER — Ambulatory Visit: Payer: Medicare HMO | Admitting: Neurology

## 2017-03-29 ENCOUNTER — Encounter: Payer: Self-pay | Admitting: Neurology

## 2017-03-31 ENCOUNTER — Emergency Department (HOSPITAL_COMMUNITY): Payer: Medicare HMO

## 2017-03-31 ENCOUNTER — Encounter (HOSPITAL_COMMUNITY): Payer: Self-pay

## 2017-03-31 ENCOUNTER — Inpatient Hospital Stay (HOSPITAL_COMMUNITY)
Admission: EM | Admit: 2017-03-31 | Discharge: 2017-04-04 | DRG: 392 | Disposition: A | Discharge status: Home / Self Care | Payer: Medicare HMO | Attending: Internal Medicine | Admitting: Internal Medicine

## 2017-03-31 DIAGNOSIS — I10 Essential (primary) hypertension: Secondary | ICD-10-CM | POA: Diagnosis present

## 2017-03-31 DIAGNOSIS — K5792 Diverticulitis of intestine, part unspecified, without perforation or abscess without bleeding: Secondary | ICD-10-CM | POA: Diagnosis present

## 2017-03-31 DIAGNOSIS — K589 Irritable bowel syndrome without diarrhea: Secondary | ICD-10-CM | POA: Diagnosis present

## 2017-03-31 DIAGNOSIS — D72829 Elevated white blood cell count, unspecified: Secondary | ICD-10-CM

## 2017-03-31 DIAGNOSIS — K5732 Diverticulitis of large intestine without perforation or abscess without bleeding: Principal | ICD-10-CM | POA: Diagnosis present

## 2017-03-31 DIAGNOSIS — E876 Hypokalemia: Secondary | ICD-10-CM | POA: Diagnosis present

## 2017-03-31 DIAGNOSIS — D649 Anemia, unspecified: Secondary | ICD-10-CM | POA: Diagnosis present

## 2017-03-31 DIAGNOSIS — E7800 Pure hypercholesterolemia, unspecified: Secondary | ICD-10-CM | POA: Diagnosis present

## 2017-03-31 DIAGNOSIS — R112 Nausea with vomiting, unspecified: Secondary | ICD-10-CM | POA: Diagnosis present

## 2017-03-31 DIAGNOSIS — Z79899 Other long term (current) drug therapy: Secondary | ICD-10-CM

## 2017-03-31 HISTORY — DX: (Idiopathic) normal pressure hydrocephalus: G91.2

## 2017-03-31 HISTORY — DX: Nontoxic single thyroid nodule: E04.1

## 2017-03-31 LAB — CBC WITH DIFFERENTIAL/PLATELET
BASOS ABS: 0 10*3/uL (ref 0.0–0.1)
BASOS PCT: 0 %
EOS ABS: 0 10*3/uL (ref 0.0–0.7)
EOS PCT: 0 %
HCT: 41.8 % (ref 36.0–46.0)
HEMOGLOBIN: 13.9 g/dL (ref 12.0–15.0)
LYMPHS ABS: 1.2 10*3/uL (ref 0.7–4.0)
Lymphocytes Relative: 7 %
MCH: 25.9 pg — ABNORMAL LOW (ref 26.0–34.0)
MCHC: 33.3 g/dL (ref 30.0–36.0)
MCV: 77.8 fL — ABNORMAL LOW (ref 78.0–100.0)
Monocytes Absolute: 0.6 10*3/uL (ref 0.1–1.0)
Monocytes Relative: 4 %
Neutro Abs: 15.9 10*3/uL — ABNORMAL HIGH (ref 1.7–7.7)
Neutrophils Relative %: 89 %
PLATELETS: 332 10*3/uL (ref 150–400)
RBC: 5.37 MIL/uL — AB (ref 3.87–5.11)
RDW: 14.1 % (ref 11.5–15.5)
WBC: 17.8 10*3/uL — AB (ref 4.0–10.5)

## 2017-03-31 LAB — COMPREHENSIVE METABOLIC PANEL
ALBUMIN: 4.2 g/dL (ref 3.5–5.0)
ALK PHOS: 71 U/L (ref 38–126)
ALT: 22 U/L (ref 14–54)
ANION GAP: 9 (ref 5–15)
AST: 23 U/L (ref 15–41)
BILIRUBIN TOTAL: 0.6 mg/dL (ref 0.3–1.2)
BUN: 16 mg/dL (ref 6–20)
CALCIUM: 9.2 mg/dL (ref 8.9–10.3)
CO2: 26 mmol/L (ref 22–32)
Chloride: 106 mmol/L (ref 101–111)
Creatinine, Ser: 0.79 mg/dL (ref 0.44–1.00)
GFR calc Af Amer: >60 mL/min (ref >60)
GFR calc non Af Amer: >60 mL/min (ref >60)
GLUCOSE: 112 mg/dL — AB (ref 65–99)
POTASSIUM: 3.3 mmol/L — AB (ref 3.5–5.1)
SODIUM: 141 mmol/L (ref 135–145)
TOTAL PROTEIN: 7.3 g/dL (ref 6.5–8.1)

## 2017-03-31 LAB — URINALYSIS, ROUTINE W REFLEX MICROSCOPIC
BILIRUBIN URINE: NEGATIVE
Glucose, UA: NEGATIVE mg/dL
HGB URINE DIPSTICK: NEGATIVE
Ketones, ur: 5 mg/dL — AB
Leukocytes, UA: NEGATIVE
NITRITE: NEGATIVE
PH: 5 (ref 5.0–8.0)
Protein, ur: NEGATIVE mg/dL
SPECIFIC GRAVITY, URINE: 1.023 (ref 1.005–1.030)

## 2017-03-31 LAB — LACTIC ACID, PLASMA: LACTIC ACID, VENOUS: 1.7 mmol/L (ref 0.5–1.9)

## 2017-03-31 LAB — LIPASE, BLOOD: Lipase: 24 U/L (ref 11–51)

## 2017-03-31 MED ORDER — POTASSIUM CHLORIDE CRYS ER 20 MEQ PO TBCR
40.0000 meq | EXTENDED_RELEASE_TABLET | Freq: Once | ORAL | Status: AC
  Administered 2017-03-31: 40 meq via ORAL
  Filled 2017-03-31: qty 2

## 2017-03-31 MED ORDER — ACETAMINOPHEN 650 MG RE SUPP: 650.0000 mg | Freq: Four times a day (QID) | RECTAL | Status: DC | PRN

## 2017-03-31 MED ORDER — MORPHINE SULFATE (PF) 4 MG/ML IV SOLN
4.0000 mg | INTRAVENOUS | Status: DC | PRN
  Filled 2017-03-31: qty 1

## 2017-03-31 MED ORDER — FLUTICASONE PROPIONATE 50 MCG/ACT NA SUSP
1.0000 | Freq: Every day | NASAL | Status: DC | PRN
  Filled 2017-03-31: qty 16

## 2017-03-31 MED ORDER — MORPHINE SULFATE (PF) 4 MG/ML IV SOLN
4.0000 mg | INTRAVENOUS | Status: DC | PRN
  Administered 2017-03-31: 4 mg via INTRAVENOUS
  Filled 2017-03-31: qty 1

## 2017-03-31 MED ORDER — KETOROLAC TROMETHAMINE 15 MG/ML IJ SOLN: 15.0000 mg | Freq: Four times a day (QID) | INTRAMUSCULAR | Status: DC | PRN

## 2017-03-31 MED ORDER — SODIUM CHLORIDE 0.9 % IV SOLN
INTRAVENOUS | Status: DC
  Administered 2017-03-31 – 2017-04-03 (×4): via INTRAVENOUS

## 2017-03-31 MED ORDER — ONDANSETRON HCL 4 MG/2ML IJ SOLN
4.0000 mg | INTRAMUSCULAR | Status: DC | PRN
  Administered 2017-03-31: 4 mg via INTRAVENOUS
  Filled 2017-03-31: qty 2

## 2017-03-31 MED ORDER — SODIUM CHLORIDE 0.9 % IV BOLUS (SEPSIS)
500.0000 mL | Freq: Once | INTRAVENOUS | Status: AC
  Administered 2017-03-31: 500 mL via INTRAVENOUS

## 2017-03-31 MED ORDER — OXYCODONE HCL 5 MG PO TABS
5.0000 mg | ORAL_TABLET | ORAL | Status: DC | PRN
  Administered 2017-03-31 – 2017-04-02 (×3): 5 mg via ORAL
  Filled 2017-03-31 (×3): qty 1

## 2017-03-31 MED ORDER — ENOXAPARIN SODIUM 40 MG/0.4ML ~~LOC~~ SOLN
40.0000 mg | SUBCUTANEOUS | Status: DC
  Administered 2017-03-31 – 2017-04-03 (×4): 40 mg via SUBCUTANEOUS
  Filled 2017-03-31 (×4): qty 0.4

## 2017-03-31 MED ORDER — ACETAMINOPHEN 325 MG PO TABS: 650.0000 mg | ORAL_TABLET | Freq: Four times a day (QID) | ORAL | Status: DC | PRN

## 2017-03-31 MED ORDER — ONDANSETRON HCL 4 MG PO TABS: 4.0000 mg | ORAL_TABLET | Freq: Four times a day (QID) | ORAL | Status: DC | PRN

## 2017-03-31 MED ORDER — MORPHINE SULFATE (PF) 2 MG/ML IV SOLN
1.0000 mg | INTRAVENOUS | Status: DC | PRN
  Administered 2017-03-31 – 2017-04-03 (×3): 2 mg via INTRAVENOUS
  Filled 2017-03-31 (×3): qty 1

## 2017-03-31 MED ORDER — ALBUTEROL SULFATE (2.5 MG/3ML) 0.083% IN NEBU
3.0000 mL | INHALATION_SOLUTION | Freq: Four times a day (QID) | RESPIRATORY_TRACT | Status: DC | PRN
  Filled 2017-03-31: qty 3

## 2017-03-31 MED ORDER — IOPAMIDOL (ISOVUE-300) INJECTION 61%
100.0000 mL | Freq: Once | INTRAVENOUS | Status: AC | PRN
  Administered 2017-03-31: 100 mL via INTRAVENOUS

## 2017-03-31 MED ORDER — CIPROFLOXACIN IN D5W 400 MG/200ML IV SOLN
400.0000 mg | Freq: Once | INTRAVENOUS | Status: AC
  Administered 2017-03-31: 400 mg via INTRAVENOUS
  Filled 2017-03-31: qty 200

## 2017-03-31 MED ORDER — ONDANSETRON HCL 4 MG/2ML IJ SOLN: 4.0000 mg | Freq: Four times a day (QID) | INTRAMUSCULAR | Status: DC | PRN

## 2017-03-31 MED ORDER — IOPAMIDOL (ISOVUE-300) INJECTION 61%
INTRAVENOUS | Status: AC
  Filled 2017-03-31: qty 100

## 2017-03-31 MED ORDER — ARTIFICIAL TEARS OPHTHALMIC OINT
1.0000 "application " | TOPICAL_OINTMENT | Freq: Two times a day (BID) | OPHTHALMIC | Status: DC
  Administered 2017-03-31 – 2017-04-04 (×9): 1 via OPHTHALMIC
  Filled 2017-03-31: qty 3.5

## 2017-03-31 MED ORDER — SODIUM CHLORIDE 0.9 % IJ SOLN
INTRAMUSCULAR | Status: AC
  Filled 2017-03-31: qty 50

## 2017-03-31 MED ORDER — METRONIDAZOLE IN NACL 5-0.79 MG/ML-% IV SOLN
500.0000 mg | Freq: Three times a day (TID) | INTRAVENOUS | Status: DC
  Administered 2017-03-31 – 2017-04-03 (×10): 500 mg via INTRAVENOUS
  Filled 2017-03-31 (×11): qty 100

## 2017-03-31 MED ORDER — CIPROFLOXACIN IN D5W 400 MG/200ML IV SOLN
400.0000 mg | Freq: Two times a day (BID) | INTRAVENOUS | Status: DC
  Administered 2017-03-31 – 2017-04-03 (×6): 400 mg via INTRAVENOUS
  Filled 2017-03-31 (×6): qty 200

## 2017-03-31 NOTE — ED Triage Notes (Signed)
She c/o varying degrees of left flank pain, gradually worsening; x 4 days. She is in no distress. Today she c/o nausea, but denies vomiting. Her daughter is with her.

## 2017-03-31 NOTE — H&P (Signed)
History and Physical    Emily Brown DEY:[id number] DOB: October 27, 1945 DOA: 03/31/2017  PCP: Emily Arnt, MD  Patient coming from: Home.   I have personally briefly reviewed patient's old medical records in [place]  Chief Complaint: left quadrant pain since 4 days.   HPI: Emily Brown is a 71 y.o. female with medical history significant of hypertension, IBS, hydrocephalus comes to ED with complaints of nausea, vomiting and abdominal pain and diarrhea since 4 days, she initially thought It was probably her IBS , but it continues to get worse and she came to ED. Her abd pain is in the left llower quadrant and radiating to the back associated with nausea, vomiting and diarrhea. While in ED, she underwent CT abd and pelvis which shows Diverticulosis of the distal descending and sigmoid colon with acute diverticulitis of the sigmoid colon in the left lower quadrant. No perforation or abscess. She was referrd to medical service for admission. She reports having fever and chills, no chest pain sob or palpitation, cough or syncope. She reports this is her first episode of diverticulitis.  Lab work reveal hypokalemia and leukocytosis of 17.8 .    Review of Systems: As per HPI otherwise 10 point review of systems negative.    Past Medical History:  Diagnosis Date  . Hypertension   . IBS (irritable bowel syndrome)   . Memory loss   . NPH (normal pressure hydrocephalus)   . Thyroid cyst     Past Surgical History:  Procedure Laterality Date  . left breast tumor removal    . PARTIAL HYSTERECTOMY       reports that she has never smoked. She has never used smokeless tobacco. She reports that she does not drink alcohol or use drugs.  Allergies  Allergen Reactions  . Brown Inhibitors Cough    sob  . Amoxicillin Rash  . Ceftin [Cefuroxime Axetil] Rash  . Penicillins Rash    Family History  Problem Relation Age of Onset  . Diabetes Mother   . Kidney disease Father     Reviewed.   Prior to Admission medications   Medication Sig Start Date End Date Taking? Authorizing Provider  albuterol (PROVENTIL HFA;VENTOLIN HFA) 108 (90 Base) MCG/ACT inhaler Inhale 1-2 puffs into the lungs every 6 (six) hours as needed for wheezing or shortness of breath. 06/04/16  Yes Emily Freeze, MD  dicyclomine (BENTYL) 20 MG tablet Take 1 tablet (20 mg total) by mouth 2 (two) times daily as needed (abdominal cramps). Patient taking differently: Take 20 mg by mouth 3 (three) times daily before meals.  06/29/13  Yes Emily Rice, MD  fluticasone [facility]) 50 MCG/ACT nasal spray Place 1 spray into both nostrils daily as needed for allergies or rhinitis.   Yes [provider]  hydrochlorothiazide (HYDRODIURIL) 25 MG tablet Take 25 mg by mouth daily. 04/25/16  Yes [provider]  Multiple Vitamins-Minerals (PRESERVISION AREDS PO) Take 1 capsule by mouth daily.    Yes [provider]  Polyethyl Glycol-Propyl Glycol (SYSTANE) 0.4-0.3 % GEL ophthalmic gel Place 1 application into both eyes 2 (two) times daily.   Yes [provider]  fexofenadine (ALLEGRA) 180 MG tablet Take 180 mg by mouth daily. 04/19/16 04/19/17  [provider]  polyethylene glycol (MIRALAX) packet Take 17 g by mouth daily. Patient not taking: Reported on 03/31/2017 12/06/14   Emily Brown    Physical Exam: Vitals:   03/31/17 0915 03/31/17 0930 03/31/17 0945 03/31/17  1102  BP:    (!) 157/83  Pulse: 93 91 92 (!) 102  Resp:    17  Temp:      TempSrc:      SpO2: 96% 96% 97% 97%    Constitutional: NAD, calm, comfortable Vitals:   03/31/17 0915 03/31/17 0930 03/31/17 0945 03/31/17 1102  BP:    (!) 157/83  Pulse: 93 91 92 (!) 102  Resp:    17  Temp:      TempSrc:      SpO2: 96% 96% 97% 97%   Eyes: PERRL, lids and conjunctivae normal ENMT: Mucous membranes are moist. Posterior pharynx clear of any exudate or lesions.Normal dentition.  Neck: normal,  supple, no masses, no thyromegaly Respiratory: clear to auscultation bilaterally, no wheezing, no crackles. Normal respiratory effort. No accessory muscle use.  Cardiovascular: Regular rate and rhythm, no murmurs / rubs / gallops. No extremity edema. 2+ pedal pulses. No carotid bruits.  Abdomen: moderate tenderness gen in the abdomen and more in the LLQ. Bowel sounds are hypoactive.  Musculoskeletal: no clubbing / cyanosis. No joint deformity upper and lower extremities. Good ROM, no contractures. Normal muscle tone.  Skin: no rashes, lesions, ulcers. No induration Neurologic: CN 2-12 grossly intact. Sensation intact, DTR normal. Strength 5/5 in all 4.  Psychiatric: Normal judgment and insight. Alert and oriented x 3. Normal mood.     Labs on Admission: I have personally reviewed following labs and imaging studies  CBC:  Recent Labs Lab 03/31/17 0855  WBC 17.8*  NEUTROABS 15.9*  HGB 13.9  HCT 41.8  MCV 77.8*  PLT 229   Basic Metabolic Panel:  Recent Labs Lab 03/31/17 0855  NA 141  K 3.3*  CL 106  CO2 26  GLUCOSE 112*  BUN 16  CREATININE 0.79  CALCIUM 9.2   GFR: CrCl cannot be calculated (Unknown ideal weight.). Liver Function Tests:  Recent Labs Lab 03/31/17 0855  AST 23  ALT 22  ALKPHOS 71  BILITOT 0.6  PROT 7.3  ALBUMIN 4.2    Recent Labs Lab 03/31/17 0855  LIPASE 24   No results for input(s): AMMONIA in the last 168 hours. Coagulation Profile: No results for input(s): INR, PROTIME in the last 168 hours. Cardiac Enzymes: No results for input(s): CKTOTAL, CKMB, CKMBINDEX, TROPONINI in the last 168 hours. BNP (last 3 results) No results for input(s): PROBNP in the last 8760 hours. HbA1C: No results for input(s): HGBA1C in the last 72 hours. CBG: No results for input(s): GLUCAP in the last 168 hours. Lipid Profile: No results for input(s): CHOL, HDL, LDLCALC, TRIG, CHOLHDL, LDLDIRECT in the last 72 hours. Thyroid Function Tests: No results for  input(s): TSH, T4TOTAL, FREET4, T3FREE, THYROIDAB in the last 72 hours. Anemia Panel: No results for input(s): VITAMINB12, FOLATE, FERRITIN, TIBC, IRON, RETICCTPCT in the last 72 hours. Urine analysis:    Component Value Date/Time   COLORURINE YELLOW 03/31/2017 0742   APPEARANCEUR CLEAR 03/31/2017 0742   LABSPEC 1.023 03/31/2017 0742   PHURINE 5.0 03/31/2017 0742   GLUCOSEU NEGATIVE 03/31/2017 0742   HGBUR NEGATIVE 03/31/2017 0742   HGBUR negative 11/18/2009 0811   BILIRUBINUR NEGATIVE 03/31/2017 0742   KETONESUR 5 (A) 03/31/2017 0742   PROTEINUR NEGATIVE 03/31/2017 0742   UROBILINOGEN 1.0 06/29/2013 0040   NITRITE NEGATIVE 03/31/2017 0742   LEUKOCYTESUR NEGATIVE 03/31/2017 0742    Radiological Exams on Admission: Dg Chest 2 View  Result Date: 03/31/2017 CLINICAL DATA:  Worsening left-sided flank pain for the past  4 days. EXAM: CHEST  2 VIEW COMPARISON:  07/06/2016; 06/04/2016; chest CT - 06/04/2016 FINDINGS: Grossly unchanged cardiac silhouette and mediastinal contours. There is persistent mild elevation / eventration involving the medial aspect of the right hemidiaphragm. Minimal bibasilar linear heterogeneous opacities favored to represent atelectasis or scar. No focal airspace opacities. No pleural effusion pneumothorax. No evidence of edema. No acute osseus abnormalities. IMPRESSION: Bibasilar atelectasis / scar without superimposed acute cardiopulmonary disease. Electronically Signed   By: Sandi Mariscal M.D.   On: 03/31/2017 09:53   Ct Abdomen Pelvis W Contrast  Result Date: 03/31/2017 CLINICAL DATA:  Left flank pain worsening over the past 4 days. Nausea. EXAM: CT ABDOMEN AND PELVIS WITH CONTRAST TECHNIQUE: Multidetector CT imaging of the abdomen and pelvis was performed using the standard protocol following bolus administration of intravenous contrast. CONTRAST:  170mL ISOVUE-300 IOPAMIDOL (ISOVUE-300) INJECTION 61% COMPARISON:  Chest CT 06/04/2016 FINDINGS: Lower chest: Evidence of  bibasilar atelectasis. Hepatobiliary: Mild diffuse low-attenuation of the liver without focal mass. Gallbladder and biliary tree are within normal. Pancreas: Within normal. Spleen: Within normal. Adrenals/Urinary Tract: Adrenal glands are normal. Kidneys normal size. No hydronephrosis or nephrolithiasis. Subcentimeter hypodensity over the mid upper pole cortex of the left kidney too small to characterize but likely a cyst. Ureters and bladder are normal. Stomach/Bowel: Stomach and small bowel are unremarkable. Appendix is normal. Diverticulosis of the descending and sigmoid colon with inflammatory change adjacent the sigmoid colon in the left lower quadrant likely due to acute diverticulitis. No adenopathy or significant wall thickening. No evidence of perforation or abscess. Small amount of adjacent free fluid in the lower abdomen. Vascular/Lymphatic: Within normal. Reproductive: Previous hysterectomy.  Adnexal regions within normal. Other: Multiple pelvic phleboliths. Musculoskeletal: Minimal degenerate change of the spine. IMPRESSION: Diverticulosis of the distal descending and sigmoid colon with acute diverticulitis of the sigmoid colon in the left lower quadrant. No perforation or abscess. Bibasilar atelectasis. Subcentimeter left renal cortical hypodensity too small to characterize, but likely a cyst. Electronically Signed   By: Marin Olp M.D.   On: 03/31/2017 10:42    EKG: not done.   Assessment/Plan Active Problems:   Diverticulitis    Diverticulitis:  Admitted for IV antibiotics, IV ciprofloxacin and IV flagyl.  Hydrate with IV fluids and IV anti emetics.  If she continues to have diarrhea, get gi pathogen pcr.  Recommend NPO except for ice chips for the next 24 hours. iV pain control.  If her pain and symptoms dont improve in the next 24 to 48 hours, please do re peat imaging and call surgery consult.    Hypokalemia:  Replaced and repeat in am.    Hypertension well controlled.     Leukocytosis: from the diverticulitis.         DVT prophylaxis: lovenox.  Code Status:  Full code.  Family Communication: family at bedside.  Disposition Plan: pending resolution of diverticulitis.  Consults called: none.  Admission status: inpatient, med surg.    Hosie Poisson MD [facility] Pager [phone]  If 7PM-7AM, please contact night-coverage [email or web address] Password [facility]  03/31/2017, 12:02 PM

## 2017-03-31 NOTE — ED Notes (Signed)
Bed: CV81 Expected date: 03/31/17 Expected time: 7:33 AM Means of arrival: Ambulance Comments: ABD Pain

## 2017-03-31 NOTE — ED Provider Notes (Signed)
[place] DEPT Provider Note   CSN: [id number] Arrival date & time: 03/31/17  8768     History   Chief Complaint Chief Complaint  Patient presents with  . Abdominal Pain  . Diarrhea  . Emesis    HPI Emily Brown is a 71 y.o. female.  The history is provided by the patient and a relative. The history is limited by the condition of the patient (memory loss).  Abdominal Pain   Associated symptoms include diarrhea and vomiting.  Diarrhea   Associated symptoms include abdominal pain and vomiting.  Emesis   Associated symptoms include abdominal pain and diarrhea.    Pt was seen at 0810. Per pt and her family:  Per pt, c/o gradual onset and persistence of constant left sided abd "pain" for the past 3 days, worse since yesterday.  Has been associated with multiple intermittent episodes of N/V/D today.  Describes the abd pain as "aching" with radiation into left flank. Describes the stools as "watery."  Denies fevers, no back pain, no rash, no CP/SOB, no black or blood in stools or emesis.      Past Medical History:  Diagnosis Date  . Hypertension   . IBS (irritable bowel syndrome)   . Memory loss   . NPH (normal pressure hydrocephalus)   . Thyroid cyst     Patient Active Problem List   Diagnosis Date Noted  . Memory loss   . IBS (irritable bowel syndrome)   . Hydrocephalus 02/07/2013  . Chronic headaches 02/07/2013  . ABDOMINAL CRAMPS 06/28/2010  . DEMENTIA, MILD 04/27/2010  . CATARACTS, BILATERAL 06/04/2008  . CERUMEN IMPACTION, RIGHT 01/28/2008  . ANXIETY STATE NOS 03/26/2007  . HYPERTENSION, BENIGN ESSENTIAL 03/26/2007  . PALPITATIONS 03/26/2007  . HEMORRHOIDS, INTERNAL 03/13/2007  . DIVERTICULOSIS, COLON W/O HEM 03/13/2007  . HYPERCHOLESTEROLEMIA, MILD 12/10/2006  . NEOP, NOS, BREAST 08/22/1975    Past Surgical History:  Procedure Laterality Date  . left breast tumor removal    . PARTIAL HYSTERECTOMY      OB History    No data available        Home Medications    Prior to Admission medications   Medication Sig Start Date End Date Taking? Authorizing Provider  albuterol (PROVENTIL HFA;VENTOLIN HFA) 108 (90 Base) MCG/ACT inhaler Inhale 1-2 puffs into the lungs every 6 (six) hours as needed for wheezing or shortness of breath. 06/04/16  Yes Drenda Freeze, MD  dicyclomine (BENTYL) 20 MG tablet Take 1 tablet (20 mg total) by mouth 2 (two) times daily as needed (abdominal cramps). Patient taking differently: Take 20 mg by mouth 3 (three) times daily before meals.  06/29/13  Yes Julianne Rice, MD  fluticasone [facility]) 50 MCG/ACT nasal spray Place 1 spray into both nostrils daily as needed for allergies or rhinitis.   Yes [provider]  hydrochlorothiazide (HYDRODIURIL) 25 MG tablet Take 25 mg by mouth daily. 04/25/16  Yes [provider]  Multiple Vitamins-Minerals (PRESERVISION AREDS PO) Take 1 capsule by mouth daily.    Yes [provider]  Polyethyl Glycol-Propyl Glycol (SYSTANE) 0.4-0.3 % GEL ophthalmic gel Place 1 application into both eyes 2 (two) times daily.   Yes [provider]  fexofenadine (ALLEGRA) 180 MG tablet Take 180 mg by mouth daily. 04/19/16 04/19/17  [provider]  polyethylene glycol (MIRALAX) packet Take 17 g by mouth daily. Patient not taking: Reported on 03/31/2017 12/06/14   Sidney Ace    Family History Family History  Problem Relation Age of Onset  . Diabetes Mother   . Kidney disease Father     Social History Social History  Substance Use Topics  . Smoking status: Never Smoker  . Smokeless tobacco: Never Used  . Alcohol use No     Allergies   Ace inhibitors; Amoxicillin; Ceftin [cefuroxime axetil]; and Penicillins   Review of Systems Review of Systems  Unable to perform ROS: Dementia  Gastrointestinal: Positive for abdominal pain, diarrhea and vomiting.     Physical Exam Updated Vital Signs BP 131/67 (BP Location: Left  Arm)   Pulse 91   Temp 99 F (37.2 C) (Oral)   Resp 18   SpO2 97%   Physical Exam 0815: Physical examination:  Nursing notes reviewed; Vital signs and O2 SAT reviewed;  Constitutional: Well developed, Well nourished, Well hydrated, Uncomfortable appearing; Head:  Normocephalic, atraumatic; Eyes: EOMI, PERRL, No scleral icterus; ENMT: Mouth and pharynx normal, Mucous membranes moist; Neck: Supple, Full range of motion, No lymphadenopathy; Cardiovascular: Regular rate and rhythm, No gallop; Respiratory: Breath sounds clear & equal bilaterally, No wheezes.  Speaking full sentences with ease, Normal respiratory effort/excursion; Chest: Nontender, Movement normal; Abdomen: Soft, +left sided abd tender to palp. No rebound or guarding. Nondistended, Normal bowel sounds; Genitourinary: No CVA tenderness; Spine:  No midline CS, TS, LS tenderness.;; Extremities: Pulses normal, No tenderness, No edema, No calf edema or asymmetry.; Neuro: Awake, alert, mildly confused re: events, Major CN grossly intact.  Speech clear. No gross focal motor or sensory deficits in extremities.; Skin: Color normal, Warm, Dry.   ED Treatments / Results  Labs (all labs ordered are listed, but only abnormal results are displayed)   EKG  EKG Interpretation None       Radiology   Procedures Procedures (including critical care time)  Medications Ordered in ED Medications  morphine 4 MG/ML injection 4 mg (not administered)  ondansetron (ZOFRAN) injection 4 mg (not administered)  sodium chloride 0.9 % bolus 500 mL (not administered)  0.9 %  sodium chloride infusion (not administered)     Initial Impression / Assessment and Plan / ED Course  I have reviewed the triage vital signs and the nursing notes.  Pertinent labs & imaging results that were available during my care of the patient were reviewed by me and considered in my medical decision making (see chart for details).  MDM Reviewed: previous chart, nursing  note and vitals Reviewed previous: labs Interpretation: labs, x-ray and CT scan   Results for orders placed or performed during the hospital encounter of 03/31/17  Urinalysis, Routine w reflex microscopic- may I&O cath if menses  Result Value Ref Range   Color, Urine YELLOW YELLOW   APPearance CLEAR CLEAR   Specific Gravity, Urine 1.023 1.005 - 1.030   pH 5.0 5.0 - 8.0   Glucose, UA NEGATIVE NEGATIVE mg/dL   Hgb urine dipstick NEGATIVE NEGATIVE   Bilirubin Urine NEGATIVE NEGATIVE   Ketones, ur 5 (A) NEGATIVE mg/dL   Protein, ur NEGATIVE NEGATIVE mg/dL   Nitrite NEGATIVE NEGATIVE   Leukocytes, UA NEGATIVE NEGATIVE  Comprehensive metabolic panel  Result Value Ref Range   Sodium 141 135 - 145 mmol/L   Potassium 3.3 (L) 3.5 - 5.1 mmol/L   Chloride 106 101 - 111 mmol/L   CO2 26 22 - 32 mmol/L   Glucose, Bld 112 (H) 65 - 99 mg/dL   BUN 16 6 - 20 mg/dL   Creatinine, Ser 0.79 0.44 - 1.00 mg/dL   Calcium  9.2 8.9 - 10.3 mg/dL   Total Protein 7.3 6.5 - 8.1 g/dL   Albumin 4.2 3.5 - 5.0 g/dL   AST 23 15 - 41 U/L   ALT 22 14 - 54 U/L   Alkaline Phosphatase 71 38 - 126 U/L   Total Bilirubin 0.6 0.3 - 1.2 mg/dL   GFR calc non Af Amer >60 >60 mL/min   GFR calc Af Amer >60 >60 mL/min   Anion gap 9 5 - 15  Lipase, blood  Result Value Ref Range   Lipase 24 11 - 51 U/L  Lactic acid, plasma  Result Value Ref Range   Lactic Acid, Venous 1.7 0.5 - 1.9 mmol/L  CBC with Differential  Result Value Ref Range   WBC 17.8 (H) 4.0 - 10.5 K/uL   RBC 5.37 (H) 3.87 - 5.11 MIL/uL   Hemoglobin 13.9 12.0 - 15.0 g/dL   HCT 41.8 36.0 - 46.0 %   MCV 77.8 (L) 78.0 - 100.0 fL   MCH 25.9 (L) 26.0 - 34.0 pg   MCHC 33.3 30.0 - 36.0 g/dL   RDW 14.1 11.5 - 15.5 %   Platelets 332 150 - 400 K/uL   Neutrophils Relative % 89 %   Neutro Abs 15.9 (H) 1.7 - 7.7 K/uL   Lymphocytes Relative 7 %   Lymphs Abs 1.2 0.7 - 4.0 K/uL   Monocytes Relative 4 %   Monocytes Absolute 0.6 0.1 - 1.0 K/uL   Eosinophils Relative  0 %   Eosinophils Absolute 0.0 0.0 - 0.7 K/uL   Basophils Relative 0 %   Basophils Absolute 0.0 0.0 - 0.1 K/uL   Dg Chest 2 View Result Date: 03/31/2017 CLINICAL DATA:  Worsening left-sided flank pain for the past 4 days. EXAM: CHEST  2 VIEW COMPARISON:  07/06/2016; 06/04/2016; chest CT - 06/04/2016 FINDINGS: Grossly unchanged cardiac silhouette and mediastinal contours. There is persistent mild elevation / eventration involving the medial aspect of the right hemidiaphragm. Minimal bibasilar linear heterogeneous opacities favored to represent atelectasis or scar. No focal airspace opacities. No pleural effusion pneumothorax. No evidence of edema. No acute osseus abnormalities. IMPRESSION: Bibasilar atelectasis / scar without superimposed acute cardiopulmonary disease. Electronically Signed   By: Sandi Mariscal M.D.   On: 03/31/2017 09:53   Ct Abdomen Pelvis W Contrast Result Date: 03/31/2017 CLINICAL DATA:  Left flank pain worsening over the past 4 days. Nausea. EXAM: CT ABDOMEN AND PELVIS WITH CONTRAST TECHNIQUE: Multidetector CT imaging of the abdomen and pelvis was performed using the standard protocol following bolus administration of intravenous contrast. CONTRAST:  165mL ISOVUE-300 IOPAMIDOL (ISOVUE-300) INJECTION 61% COMPARISON:  Chest CT 06/04/2016 FINDINGS: Lower chest: Evidence of bibasilar atelectasis. Hepatobiliary: Mild diffuse low-attenuation of the liver without focal mass. Gallbladder and biliary tree are within normal. Pancreas: Within normal. Spleen: Within normal. Adrenals/Urinary Tract: Adrenal glands are normal. Kidneys normal size. No hydronephrosis or nephrolithiasis. Subcentimeter hypodensity over the mid upper pole cortex of the left kidney too small to characterize but likely a cyst. Ureters and bladder are normal. Stomach/Bowel: Stomach and small bowel are unremarkable. Appendix is normal. Diverticulosis of the descending and sigmoid colon with inflammatory change adjacent the  sigmoid colon in the left lower quadrant likely due to acute diverticulitis. No adenopathy or significant wall thickening. No evidence of perforation or abscess. Small amount of adjacent free fluid in the lower abdomen. Vascular/Lymphatic: Within normal. Reproductive: Previous hysterectomy.  Adnexal regions within normal. Other: Multiple pelvic phleboliths. Musculoskeletal: Minimal degenerate change of the spine.  IMPRESSION: Diverticulosis of the distal descending and sigmoid colon with acute diverticulitis of the sigmoid colon in the left lower quadrant. No perforation or abscess. Bibasilar atelectasis. Subcentimeter left renal cortical hypodensity too small to characterize, but likely a cyst. Electronically Signed   By: Marin Olp M.D.   On: 03/31/2017 10:42     1115:  CT as above with elevated WBC count; will start IV cipro/flagyl. Dx and testing d/w pt and family.  Questions answered.  Verb understanding, agreeable to admit.  T/C to [facility]. Karleen Hampshire, case discussed, including:  HPI, pertinent PM/SHx, VS/PE, dx testing, ED course and treatment:  Agreeable to admit.    Final Clinical Impressions(s) / ED Diagnoses   Final diagnoses:  None    New Prescriptions New Prescriptions   No medications on file      Francine Graven, DO 04/01/17 0820

## 2017-04-01 LAB — BASIC METABOLIC PANEL
Anion gap: 9 (ref 5–15)
BUN: 14 mg/dL (ref 6–20)
CHLORIDE: 109 mmol/L (ref 101–111)
CO2: 25 mmol/L (ref 22–32)
Calcium: 8.5 mg/dL — ABNORMAL LOW (ref 8.9–10.3)
Creatinine, Ser: 0.91 mg/dL (ref 0.44–1.00)
GFR calc Af Amer: >60 mL/min (ref >60)
GFR calc non Af Amer: >60 mL/min (ref >60)
Glucose, Bld: 105 mg/dL — ABNORMAL HIGH (ref 65–99)
POTASSIUM: 3.3 mmol/L — AB (ref 3.5–5.1)
SODIUM: 143 mmol/L (ref 135–145)

## 2017-04-01 LAB — CBC
HEMATOCRIT: 37.2 % (ref 36.0–46.0)
Hemoglobin: 12.3 g/dL (ref 12.0–15.0)
MCH: 25.6 pg — ABNORMAL LOW (ref 26.0–34.0)
MCHC: 33.1 g/dL (ref 30.0–36.0)
MCV: 77.5 fL — ABNORMAL LOW (ref 78.0–100.0)
Platelets: 312 10*3/uL (ref 150–400)
RBC: 4.8 MIL/uL (ref 3.87–5.11)
RDW: 14.2 % (ref 11.5–15.5)
WBC: 17.2 10*3/uL — AB (ref 4.0–10.5)

## 2017-04-01 LAB — URINE CULTURE

## 2017-04-01 LAB — MAGNESIUM: MAGNESIUM: 1.9 mg/dL (ref 1.7–2.4)

## 2017-04-01 MED ORDER — POTASSIUM CHLORIDE CRYS ER 20 MEQ PO TBCR
40.0000 meq | EXTENDED_RELEASE_TABLET | Freq: Once | ORAL | Status: AC
Start: 2017-04-01 — End: 2017-04-01
  Administered 2017-04-01: 40 meq via ORAL
  Filled 2017-04-01: qty 2

## 2017-04-01 NOTE — Progress Notes (Signed)
[facility] PROGRESS NOTE  Emily Brown [id number]:[id number] DOB: 1945-11-30 DOA: 03/31/2017  PCP: Leamon Arnt, MD  Brief History/Interval Summary: 71 year old female with a past medical history of hypertension, tubal bowel syndrome presented with complaints of nausea, vomiting, abdominal pain and diarrhea. She underwent a CT scan which showed diverticulitis involving the sigmoid colon. Patient was hospitalized for further management.  Reason for Visit: Acute sigmoid diverticulitis  Consultants: None  Procedures: None  Antibiotics: Intravenous ciprofloxacin and Flagyl  Subjective/Interval History: Patient feels is slightly better this morning. Pain is down to about 3 out of 10 in intensity. Denies any nausea, vomiting. Hasn't had any more bowel movements. Is passing gas from below.  ROS: Denies chest pain or shortness of breath.  Objective:  Vital Signs  Vitals:   03/31/17 1130 03/31/17 1308 03/31/17 1947 04/01/17 0614  BP:  (!) 123/54 121/63 123/64  Pulse: (!) 102 (!) 102 (!) 101 95  Resp:  18 16 16   Temp:  99.5 F (37.5 C) 99.3 F (37.4 C) 99.6 F (37.6 C)  TempSrc:  Oral Oral Oral  SpO2: 96% 99% 96% 95%  Weight:  82.7 kg (182 lb 5.1 oz)    Height:  5\' 7"  (1.702 m)      Intake/Output Summary (Last 24 hours) at 04/01/17 1106 Last data filed at 04/01/17 2353  Gross per 24 hour  Intake          2343.33 ml  Output                0 ml  Net          2343.33 ml   Filed Weights   03/31/17 1308  Weight: 82.7 kg (182 lb 5.1 oz)    General appearance: alert, cooperative, appears stated age and no distress Head: Normocephalic, without obvious abnormality, atraumatic Resp: clear to auscultation bilaterally Cardio: regular rate and rhythm, S1, S2 normal, no murmur, click, rub or gallop GI: Abdomen is soft. tender in the left lower quadrant without any rebound, rigidity or guarding. No masses or organomegaly. Bowel sounds are present. Extremities: extremities  normal, atraumatic, no cyanosis or edema Neurologic: No focal deficits.  Lab Results:  Data Reviewed: I have personally reviewed following labs and imaging studies  CBC:  Recent Labs Lab 03/31/17 0855 04/01/17 0623  WBC 17.8* 17.2*  NEUTROABS 15.9*  --   HGB 13.9 12.3  HCT 41.8 37.2  MCV 77.8* 77.5*  PLT 332 614    Basic Metabolic Panel:  Recent Labs Lab 03/31/17 0855 04/01/17 0623  NA 141 143  K 3.3* 3.3*  CL 106 109  CO2 26 25  GLUCOSE 112* 105*  BUN 16 14  CREATININE 0.79 0.91  CALCIUM 9.2 8.5*  MG  --  1.9    GFR: Estimated Creatinine Clearance: 65.4 mL/min (by C-G formula based on SCr of 0.91 mg/dL).  Liver Function Tests:  Recent Labs Lab 03/31/17 0855  AST 23  ALT 22  ALKPHOS 71  BILITOT 0.6  PROT 7.3  ALBUMIN 4.2     Recent Labs Lab 03/31/17 0855  LIPASE 24    Radiology Studies: Dg Chest 2 View  Result Date: 03/31/2017 CLINICAL DATA:  Worsening left-sided flank pain for the past 4 days. EXAM: CHEST  2 VIEW COMPARISON:  07/06/2016; 06/04/2016; chest CT - 06/04/2016 FINDINGS: Grossly unchanged cardiac silhouette and mediastinal contours. There is persistent mild elevation / eventration involving the medial aspect of the right hemidiaphragm. Minimal bibasilar linear heterogeneous opacities favored  to represent atelectasis or scar. No focal airspace opacities. No pleural effusion pneumothorax. No evidence of edema. No acute osseus abnormalities. IMPRESSION: Bibasilar atelectasis / scar without superimposed acute cardiopulmonary disease. Electronically Signed   By: Sandi Mariscal M.D.   On: 03/31/2017 09:53   Ct Abdomen Pelvis W Contrast  Result Date: 03/31/2017 CLINICAL DATA:  Left flank pain worsening over the past 4 days. Nausea. EXAM: CT ABDOMEN AND PELVIS WITH CONTRAST TECHNIQUE: Multidetector CT imaging of the abdomen and pelvis was performed using the standard protocol following bolus administration of intravenous contrast. CONTRAST:  142mL  ISOVUE-300 IOPAMIDOL (ISOVUE-300) INJECTION 61% COMPARISON:  Chest CT 06/04/2016 FINDINGS: Lower chest: Evidence of bibasilar atelectasis. Hepatobiliary: Mild diffuse low-attenuation of the liver without focal mass. Gallbladder and biliary tree are within normal. Pancreas: Within normal. Spleen: Within normal. Adrenals/Urinary Tract: Adrenal glands are normal. Kidneys normal size. No hydronephrosis or nephrolithiasis. Subcentimeter hypodensity over the mid upper pole cortex of the left kidney too small to characterize but likely a cyst. Ureters and bladder are normal. Stomach/Bowel: Stomach and small bowel are unremarkable. Appendix is normal. Diverticulosis of the descending and sigmoid colon with inflammatory change adjacent the sigmoid colon in the left lower quadrant likely due to acute diverticulitis. No adenopathy or significant wall thickening. No evidence of perforation or abscess. Small amount of adjacent free fluid in the lower abdomen. Vascular/Lymphatic: Within normal. Reproductive: Previous hysterectomy.  Adnexal regions within normal. Other: Multiple pelvic phleboliths. Musculoskeletal: Minimal degenerate change of the spine. IMPRESSION: Diverticulosis of the distal descending and sigmoid colon with acute diverticulitis of the sigmoid colon in the left lower quadrant. No perforation or abscess. Bibasilar atelectasis. Subcentimeter left renal cortical hypodensity too small to characterize, but likely a cyst. Electronically Signed   By: Marin Olp M.D.   On: 03/31/2017 10:42     Medications:  Scheduled: . artificial tears  1 application Both Eyes BID  . enoxaparin (LOVENOX) injection  40 mg Subcutaneous Q24H   Continuous: . sodium chloride 75 mL/hr at 04/01/17 0900  . ciprofloxacin 400 mg (04/01/17 1059)  . metronidazole Stopped (04/01/17 0400)   DJT:[id number] **OR** acetaminophen, albuterol, fluticasone, ketorolac, morphine injection, ondansetron, ondansetron **OR** ondansetron  (ZOFRAN) IV, oxyCODONE  Assessment/Plan:  Active Problems:   HYPERCHOLESTEROLEMIA, MILD   HYPERTENSION, BENIGN ESSENTIAL   IBS (irritable bowel syndrome)   Diverticulitis   Hypokalemia    Acute sigmoid diverticulitis. No evidence for perforation or abscess on the CT scan. Patient was placed on intravenous ciprofloxacin and metronidazole. She seems to be slowly improving. WBC is stable. She's afebrile. Hasn't had any further episodes of nausea or vomiting. Try clear liquid diet today. Pain control. Ambulation. Last colonoscopy was in 2007 and might have been done by [facility] gastroenterology. She will need another colonoscopy in a few weeks' time.  Hypokalemia. This will be repleted. Check magnesium level.  History of essential hypertension Monitor blood pressures closely. Stable so far.  DVT Prophylaxis: Lovenox    Code Status: Full code  Family Communication: Discussed with the patient and her sister  Disposition Plan: Management as outlined above. Mobilize.    LOS: 1 day   [place] [facility] Pager [phone] 04/01/2017, 11:06 AM  If 7PM-7AM, please contact night-coverage at [email or web address], password [email or web address]

## 2017-04-02 LAB — CBC
HCT: 33.3 % — ABNORMAL LOW (ref 36.0–46.0)
Hemoglobin: 10.9 g/dL — ABNORMAL LOW (ref 12.0–15.0)
MCH: 25.7 pg — AB (ref 26.0–34.0)
MCHC: 32.7 g/dL (ref 30.0–36.0)
MCV: 78.5 fL (ref 78.0–100.0)
PLATELETS: 277 10*3/uL (ref 150–400)
RBC: 4.24 MIL/uL (ref 3.87–5.11)
RDW: 14.4 % (ref 11.5–15.5)
WBC: 14.4 10*3/uL — ABNORMAL HIGH (ref 4.0–10.5)

## 2017-04-02 LAB — BASIC METABOLIC PANEL
ANION GAP: 6 (ref 5–15)
BUN: 8 mg/dL (ref 6–20)
CALCIUM: 8.4 mg/dL — AB (ref 8.9–10.3)
CO2: 25 mmol/L (ref 22–32)
Chloride: 109 mmol/L (ref 101–111)
Creatinine, Ser: 0.81 mg/dL (ref 0.44–1.00)
GFR calc Af Amer: >60 mL/min (ref >60)
Glucose, Bld: 99 mg/dL (ref 65–99)
Potassium: 3.3 mmol/L — ABNORMAL LOW (ref 3.5–5.1)
SODIUM: 140 mmol/L (ref 135–145)

## 2017-04-02 LAB — MAGNESIUM: MAGNESIUM: 1.9 mg/dL (ref 1.7–2.4)

## 2017-04-02 MED ORDER — POTASSIUM CHLORIDE CRYS ER 20 MEQ PO TBCR
40.0000 meq | EXTENDED_RELEASE_TABLET | Freq: Once | ORAL | Status: AC
Start: 2017-04-02 — End: 2017-04-02
  Administered 2017-04-02: 40 meq via ORAL
  Filled 2017-04-02: qty 2

## 2017-04-02 NOTE — Care Management Note (Signed)
Case Management Note  Patient Details  Name: Emily Brown MRN: [id number] Date of Birth: 27-Feb-1946  Subjective/Objective:        sepsis            Action/Plan: Date:  April 02, 2017  Chart reviewed for concurrent status and case management needs.  Will continue to follow patient progress.  Discharge Planning: following for needs  Expected discharge date: [id number]  Velva Harman, BSN, [place], [place]   Expected Discharge Date:                  Expected Discharge Plan:  Home/Self Care  In-House Referral:     Discharge planning Services  CM Consult  Post Acute Care Choice:    Choice offered to:     DME Arranged:    DME Agency:     HH Arranged:    HH Agency:     Status of Service:  In process, will continue to follow  If discussed at Long Length of Stay Meetings, dates discussed:    Additional Comments:  Leeroy Cha, RN 04/02/2017, 9:35 AM

## 2017-04-02 NOTE — Progress Notes (Signed)
[facility] PROGRESS NOTE  Shakendra Griffeth Ruud MMN:[id number] DOB: [birth year] DOA: 03/31/2017  PCP: Leamon Arnt, MD  Brief History/Interval Summary: 71 year old female with a past medical history of hypertension, tubal bowel syndrome presented with complaints of nausea, vomiting, abdominal pain and diarrhea. She underwent a CT scan which showed diverticulitis involving the sigmoid colon. Patient was hospitalized for further management.  Reason for Visit: Acute sigmoid diverticulitis  Consultants: None  Procedures: None  Antibiotics: Intravenous ciprofloxacin and Flagyl  Subjective/Interval History: Patient feels well this morning. Continues to have pain in the left lower side of her abdomen. Had a BM yesterday. Apparently she is not passing any gas. Tolerating her clear liquids. Denies any nausea, vomiting.   ROS: Denies any chest pain or shortness of breath.  Objective:  Vital Signs  Vitals:   04/01/17 1416 04/01/17 2130 04/01/17 2213 04/02/17 0602  BP: (!) 118/47 (!) 120/50 122/72 112/72  Pulse: 88 89 97 94  Resp: 16 18 16 16   Temp: 98.1 F (36.7 C) 99.7 F (37.6 C) 99.7 F (37.6 C) 99.4 F (37.4 C)  TempSrc: Oral Oral Oral Oral  SpO2: 100% 97% 97% 94%  Weight: 84.8 kg (186 lb 15.2 oz)  83.8 kg (184 lb 11.9 oz) 83.5 kg (184 lb 1.4 oz)  Height:        Intake/Output Summary (Last 24 hours) at 04/02/17 0926 Last data filed at 04/02/17 0806  Gross per 24 hour  Intake          2788.33 ml  Output                0 ml  Net          2788.33 ml   Filed Weights   04/01/17 1416 04/01/17 2213 04/02/17 0602  Weight: 84.8 kg (186 lb 15.2 oz) 83.8 kg (184 lb 11.9 oz) 83.5 kg (184 lb 1.4 oz)    General appearance: Awake, alert. In no distress. Resp: Clear to auscultation bilaterally. Cardio: S1, S2 is normal, regular. No S3, S4. No rubs, murmurs or bruit. GI: Abdomen is soft. Remains tender in the left lower quadrant. No rebound, rigidity or guarding. No masses or  organomegaly. Bowel sounds are present. Extremities: No edema Neurologic: No focal deficits.  Lab Results:  Data Reviewed: I have personally reviewed following labs and imaging studies  CBC:  Recent Labs Lab 03/31/17 0855 04/01/17 0623 04/02/17 0552  WBC 17.8* 17.2* 14.4*  NEUTROABS 15.9*  --   --   HGB 13.9 12.3 10.9*  HCT 41.8 37.2 33.3*  MCV 77.8* 77.5* 78.5  PLT 332 312 903    Basic Metabolic Panel:  Recent Labs Lab 03/31/17 0855 04/01/17 0623 04/02/17 0552  NA 141 143 140  K 3.3* 3.3* 3.3*  CL 106 109 109  CO2 26 25 25   GLUCOSE 112* 105* 99  BUN 16 14 8   CREATININE 0.79 0.91 0.81  CALCIUM 9.2 8.5* 8.4*  MG  --  1.9 1.9    GFR: Estimated Creatinine Clearance: 73.9 mL/min (by C-G formula based on SCr of 0.81 mg/dL).  Liver Function Tests:  Recent Labs Lab 03/31/17 0855  AST 23  ALT 22  ALKPHOS 71  BILITOT 0.6  PROT 7.3  ALBUMIN 4.2     Recent Labs Lab 03/31/17 0855  LIPASE 24    Radiology Studies: Dg Chest 2 View  Result Date: 03/31/2017 CLINICAL DATA:  Worsening left-sided flank pain for the past 4 days. EXAM: CHEST  2 VIEW COMPARISON:  07/06/2016; 06/04/2016; chest CT - 06/04/2016 FINDINGS: Grossly unchanged cardiac silhouette and mediastinal contours. There is persistent mild elevation / eventration involving the medial aspect of the right hemidiaphragm. Minimal bibasilar linear heterogeneous opacities favored to represent atelectasis or scar. No focal airspace opacities. No pleural effusion pneumothorax. No evidence of edema. No acute osseus abnormalities. IMPRESSION: Bibasilar atelectasis / scar without superimposed acute cardiopulmonary disease. Electronically Signed   By: Sandi Mariscal M.D.   On: 03/31/2017 09:53   Ct Abdomen Pelvis W Contrast  Result Date: 03/31/2017 CLINICAL DATA:  Left flank pain worsening over the past 4 days. Nausea. EXAM: CT ABDOMEN AND PELVIS WITH CONTRAST TECHNIQUE: Multidetector CT imaging of the abdomen and  pelvis was performed using the standard protocol following bolus administration of intravenous contrast. CONTRAST:  167mL ISOVUE-300 IOPAMIDOL (ISOVUE-300) INJECTION 61% COMPARISON:  Chest CT 06/04/2016 FINDINGS: Lower chest: Evidence of bibasilar atelectasis. Hepatobiliary: Mild diffuse low-attenuation of the liver without focal mass. Gallbladder and biliary tree are within normal. Pancreas: Within normal. Spleen: Within normal. Adrenals/Urinary Tract: Adrenal glands are normal. Kidneys normal size. No hydronephrosis or nephrolithiasis. Subcentimeter hypodensity over the mid upper pole cortex of the left kidney too small to characterize but likely a cyst. Ureters and bladder are normal. Stomach/Bowel: Stomach and small bowel are unremarkable. Appendix is normal. Diverticulosis of the descending and sigmoid colon with inflammatory change adjacent the sigmoid colon in the left lower quadrant likely due to acute diverticulitis. No adenopathy or significant wall thickening. No evidence of perforation or abscess. Small amount of adjacent free fluid in the lower abdomen. Vascular/Lymphatic: Within normal. Reproductive: Previous hysterectomy.  Adnexal regions within normal. Other: Multiple pelvic phleboliths. Musculoskeletal: Minimal degenerate change of the spine. IMPRESSION: Diverticulosis of the distal descending and sigmoid colon with acute diverticulitis of the sigmoid colon in the left lower quadrant. No perforation or abscess. Bibasilar atelectasis. Subcentimeter left renal cortical hypodensity too small to characterize, but likely a cyst. Electronically Signed   By: Marin Olp M.D.   On: 03/31/2017 10:42     Medications:  Scheduled: . artificial tears  1 application Both Eyes BID  . enoxaparin (LOVENOX) injection  40 mg Subcutaneous Q24H  . potassium chloride  40 mEq Oral Once   Continuous: . sodium chloride 75 mL/hr at 04/02/17 0030  . ciprofloxacin 400 mg (04/02/17 0918)  . metronidazole Stopped  (04/02/17 0317)   GYJ:[id number] **OR** acetaminophen, albuterol, fluticasone, ketorolac, morphine injection, ondansetron, ondansetron **OR** ondansetron (ZOFRAN) IV, oxyCODONE  Assessment/Plan:  Active Problems:   HYPERCHOLESTEROLEMIA, MILD   HYPERTENSION, BENIGN ESSENTIAL   IBS (irritable bowel syndrome)   Diverticulitis   Hypokalemia    Acute sigmoid diverticulitis. No evidence for perforation or abscess on the CT scan. Patient was placed on intravenous ciprofloxacin and metronidazole. Patient is slow to improve. Still has tenderness in the left lower quadrant. Continue just clear liquids for now. Ambulation. Pain control. Last colonoscopy was in 2007 and might have been done by [facility] gastroenterology. She will need another colonoscopy in a few weeks' time.  Hypokalemia. This will be repleted. Magnesium level normal.  History of essential hypertension Monitor blood pressures closely. Stable so far.  DVT Prophylaxis: Lovenox    Code Status: Full code  Family Communication: Discussed with the patient Disposition Plan: Management as outlined above. Mobilize.    LOS: 2 days   [place] [facility] Pager [phone] 04/02/2017, 9:26 AM  If 7PM-7AM, please contact night-coverage at [email or web address], password [facility]

## 2017-04-03 DIAGNOSIS — D649 Anemia, unspecified: Secondary | ICD-10-CM

## 2017-04-03 LAB — CBC
HCT: 33.3 % — ABNORMAL LOW (ref 36.0–46.0)
Hemoglobin: 10.8 g/dL — ABNORMAL LOW (ref 12.0–15.0)
MCH: 24.8 pg — AB (ref 26.0–34.0)
MCHC: 32.4 g/dL (ref 30.0–36.0)
MCV: 76.4 fL — ABNORMAL LOW (ref 78.0–100.0)
PLATELETS: 273 10*3/uL (ref 150–400)
RBC: 4.36 MIL/uL (ref 3.87–5.11)
RDW: 14 % (ref 11.5–15.5)
WBC: 8.5 10*3/uL (ref 4.0–10.5)

## 2017-04-03 LAB — BASIC METABOLIC PANEL
ANION GAP: 7 (ref 5–15)
BUN: 6 mg/dL (ref 6–20)
CALCIUM: 8.5 mg/dL — AB (ref 8.9–10.3)
CO2: 25 mmol/L (ref 22–32)
CREATININE: 0.72 mg/dL (ref 0.44–1.00)
Chloride: 111 mmol/L (ref 101–111)
GLUCOSE: 92 mg/dL (ref 65–99)
Potassium: 3.4 mmol/L — ABNORMAL LOW (ref 3.5–5.1)
Sodium: 143 mmol/L (ref 135–145)

## 2017-04-03 MED ORDER — ALBUTEROL SULFATE (2.5 MG/3ML) 0.083% IN NEBU
2.5000 mg | INHALATION_SOLUTION | RESPIRATORY_TRACT | Status: DC
  Administered 2017-04-03: 2.5 mg via RESPIRATORY_TRACT

## 2017-04-03 MED ORDER — POTASSIUM CHLORIDE CRYS ER 20 MEQ PO TBCR
40.0000 meq | EXTENDED_RELEASE_TABLET | Freq: Once | ORAL | Status: AC
  Administered 2017-04-03: 40 meq via ORAL
  Filled 2017-04-03: qty 2

## 2017-04-03 MED ORDER — ALBUTEROL SULFATE (2.5 MG/3ML) 0.083% IN NEBU
2.5000 mg | INHALATION_SOLUTION | Freq: Three times a day (TID) | RESPIRATORY_TRACT | Status: DC
  Administered 2017-04-04 (×2): 2.5 mg via RESPIRATORY_TRACT
  Filled 2017-04-03 (×2): qty 3

## 2017-04-03 MED ORDER — METRONIDAZOLE 500 MG PO TABS
500.0000 mg | ORAL_TABLET | Freq: Three times a day (TID) | ORAL | Status: DC
  Administered 2017-04-03 – 2017-04-04 (×3): 500 mg via ORAL
  Filled 2017-04-03 (×4): qty 1

## 2017-04-03 MED ORDER — CIPROFLOXACIN HCL 500 MG PO TABS
500.0000 mg | ORAL_TABLET | Freq: Two times a day (BID) | ORAL | Status: DC
  Administered 2017-04-03 – 2017-04-04 (×2): 500 mg via ORAL
  Filled 2017-04-03 (×2): qty 1

## 2017-04-03 NOTE — Progress Notes (Signed)
[facility] PROGRESS NOTE  Emily Brown WRU:[id number] DOB: 03-10-1946 DOA: 03/31/2017  PCP: Leamon Arnt, MD  Brief History/Interval Summary: 71 year old female with a past medical history of hypertension, tubal bowel syndrome presented with complaints of nausea, vomiting, abdominal pain and diarrhea. She underwent a CT scan which showed diverticulitis involving the sigmoid colon. Patient was hospitalized for further management.   Reason for Visit: Acute sigmoid diverticulitis  Consultants: None  Procedures: None  Antibiotics: ciprofloxacin and Flagyl  Subjective/Interval History: Patient feels better. Abdominal pain has improved. Tolerating her liquid diet. Denies any nausea, vomiting. Passing gas. Had a bowel movement yesterday.   ROS: Denies any chest pain or shortness of breath.  Objective:  Vital Signs  Vitals:   04/02/17 0602 04/02/17 1300 04/02/17 2140 04/03/17 0617  BP: 112/72 113/61 115/61 (!) 137/91  Pulse: 94 87 82 81  Resp: 16 16 16 16   Temp: 99.4 F (37.4 C) (!) 97.5 F (36.4 C) 98.2 F (36.8 C) 97.8 F (36.6 C)  TempSrc: Oral Oral Oral Oral  SpO2: 94% 100% 98% 98%  Weight: 83.5 kg (184 lb 1.4 oz) 84.1 kg (185 lb 6.5 oz)    Height:        Intake/Output Summary (Last 24 hours) at 04/03/17 0950 Last data filed at 04/03/17 0805  Gross per 24 hour  Intake              950 ml  Output                0 ml  Net              950 ml   Filed Weights   04/01/17 2213 04/02/17 0602 04/02/17 1300  Weight: 83.8 kg (184 lb 11.9 oz) 83.5 kg (184 lb 1.4 oz) 84.1 kg (185 lb 6.5 oz)    General appearance: Awake, alert. In no distress Resp: Clear to auscultation bilaterally Cardio: S1, S2 is normal, regular. No S3, S4. No rubs, murmurs or GI: Abdomen is soft. Very minimal tenderness in the left lower quadrant today. No rebound, rigidity or guarding. Bowel sounds are present, normal. Extremities: No edema Neurologic: No focal deficits.  Lab  Results:  Data Reviewed: I have personally reviewed following labs and imaging studies  CBC:  Recent Labs Lab 03/31/17 0855 04/01/17 0623 04/02/17 0552 04/03/17 0552  WBC 17.8* 17.2* 14.4* 8.5  NEUTROABS 15.9*  --   --   --   HGB 13.9 12.3 10.9* 10.8*  HCT 41.8 37.2 33.3* 33.3*  MCV 77.8* 77.5* 78.5 76.4*  PLT 332 312 277 914    Basic Metabolic Panel:  Recent Labs Lab 03/31/17 0855 04/01/17 0623 04/02/17 0552 04/03/17 0552  NA 141 143 140 143  K 3.3* 3.3* 3.3* 3.4*  CL 106 109 109 111  CO2 26 25 25 25   GLUCOSE 112* 105* 99 92  BUN 16 14 8 6   CREATININE 0.79 0.91 0.81 0.72  CALCIUM 9.2 8.5* 8.4* 8.5*  MG  --  1.9 1.9  --     GFR: Estimated Creatinine Clearance: 75 mL/min (by C-G formula based on SCr of 0.72 mg/dL).  Liver Function Tests:  Recent Labs Lab 03/31/17 0855  AST 23  ALT 22  ALKPHOS 71  BILITOT 0.6  PROT 7.3  ALBUMIN 4.2     Recent Labs Lab 03/31/17 0855  LIPASE 24    Radiology Studies: No results found.   Medications:  Scheduled: . artificial tears  1 application Both Eyes  BID  . enoxaparin (LOVENOX) injection  40 mg Subcutaneous Q24H   Continuous: . sodium chloride 75 mL/hr at 04/02/17 0030  . ciprofloxacin 400 mg (04/03/17 0925)  . metronidazole Stopped (04/03/17 2080)   [id number] **OR** acetaminophen, albuterol, fluticasone, ketorolac, morphine injection, ondansetron, ondansetron **OR** ondansetron (ZOFRAN) IV, oxyCODONE  Assessment/Plan:  Active Problems:   HYPERCHOLESTEROLEMIA, MILD   HYPERTENSION, BENIGN ESSENTIAL   IBS (irritable bowel syndrome)   Diverticulitis   Hypokalemia    Acute sigmoid diverticulitis. No evidence for perforation or abscess on the CT scan. Patient was placed on intravenous ciprofloxacin and metronidazole. Patient has been slow to improve but feels better today. WBC is normal. Advance to full liquids and then to soft diet later today if she tolerates. Change to oral antibiotics.  Ambulation. Pain control. Last colonoscopy was in 2007 and might have been done by [facility] gastroenterology. She will need another colonoscopy in a few weeks' time.  Hypokalemia. This will be repleted. Magnesium level normal.  History of essential hypertension Monitor blood pressures closely. Stable so far.  Normocytic anemia. Drop in hemoglobin, likely dilutional. No evidence for overt bleeding. She will need outpatient workup for the same.  DVT Prophylaxis: Lovenox    Code Status: Full code  Family Communication: Discussed with the patient Disposition Plan: Management as outlined above. Change to oral antibiotics today. Advance diet today. Possible discharge tomorrow.    LOS: 3 days   [place] [facility] Pager [phone] 04/03/2017, 9:50 AM  If 7PM-7AM, please contact night-coverage at [email or web address], password [facility]

## 2017-04-03 NOTE — Progress Notes (Signed)
Pt reports developing a cough late afternoon today that has progressively gotten worse since 2000 tonight, developing an exp wheeze and frequent non productive cough. Pt reports a hx of asthma / bronchitis. On call [place] notified & orders obtained.

## 2017-04-04 DIAGNOSIS — I10 Essential (primary) hypertension: Secondary | ICD-10-CM

## 2017-04-04 DIAGNOSIS — K5792 Diverticulitis of intestine, part unspecified, without perforation or abscess without bleeding: Secondary | ICD-10-CM

## 2017-04-04 DIAGNOSIS — E876 Hypokalemia: Secondary | ICD-10-CM

## 2017-04-04 LAB — BASIC METABOLIC PANEL
Anion gap: 8 (ref 5–15)
BUN: 6 mg/dL (ref 6–20)
CHLORIDE: 110 mmol/L (ref 101–111)
CO2: 24 mmol/L (ref 22–32)
Calcium: 9 mg/dL (ref 8.9–10.3)
Creatinine, Ser: 0.84 mg/dL (ref 0.44–1.00)
GFR calc Af Amer: >60 mL/min (ref >60)
GFR calc non Af Amer: >60 mL/min (ref >60)
GLUCOSE: 100 mg/dL — AB (ref 65–99)
POTASSIUM: 3.5 mmol/L (ref 3.5–5.1)
Sodium: 142 mmol/L (ref 135–145)

## 2017-04-04 LAB — CBC
HEMATOCRIT: 35.6 % — AB (ref 36.0–46.0)
Hemoglobin: 11.6 g/dL — ABNORMAL LOW (ref 12.0–15.0)
MCH: 25.3 pg — AB (ref 26.0–34.0)
MCHC: 32.6 g/dL (ref 30.0–36.0)
MCV: 77.7 fL — AB (ref 78.0–100.0)
Platelets: 340 10*3/uL (ref 150–400)
RBC: 4.58 MIL/uL (ref 3.87–5.11)
RDW: 14.1 % (ref 11.5–15.5)
WBC: 8.4 10*3/uL (ref 4.0–10.5)

## 2017-04-04 LAB — MAGNESIUM: Magnesium: 1.8 mg/dL (ref 1.7–2.4)

## 2017-04-04 MED ORDER — METRONIDAZOLE 500 MG PO TABS: 500.0000 mg | ORAL_TABLET | Freq: Three times a day (TID) | ORAL | 0 refills | Status: DC

## 2017-04-04 MED ORDER — CIPROFLOXACIN HCL 500 MG PO TABS: 500.0000 mg | ORAL_TABLET | Freq: Two times a day (BID) | ORAL | 0 refills | Status: DC

## 2017-04-04 MED ORDER — FUROSEMIDE 10 MG/ML IJ SOLN
20.0000 mg | Freq: Once | INTRAMUSCULAR | Status: AC
  Administered 2017-04-04: 20 mg via INTRAVENOUS
  Filled 2017-04-04: qty 2

## 2017-04-04 MED ORDER — POTASSIUM CHLORIDE CRYS ER 20 MEQ PO TBCR
40.0000 meq | EXTENDED_RELEASE_TABLET | Freq: Two times a day (BID) | ORAL | Status: DC
  Administered 2017-04-04: 40 meq via ORAL
  Filled 2017-04-04: qty 2

## 2017-04-04 NOTE — Discharge Summary (Signed)
Physician Discharge Summary  Nyanna Heideman Pavlovich BDZ:[id number] DOB: 27-Nov-1945 DOA: 03/31/2017  PCP: Leamon Arnt, MD  Admit date: 03/31/2017 Discharge date: 04/04/2017  Admitted From: home Disposition:  Home  Recommendations for Outpatient Follow-up:  1. Follow up with PCP in 1-2 weeks. 2. Follow-up with [facility] urology in 2-4 weeks 3. Please obtain BMP/CBC in one week 4. Please follow up on the following pending results:  Home Health: No Equipment/Devices:None  Discharge Condition:stable CODE STATUS:full Diet recommendation: Heart Healthy  Brief/Interim Summary: 71 year old with past medical history of hypertension who presents with nausea and vomiting CT scan showed acute diverticulitis  Discharge Diagnoses:  Active Problems:   HYPERCHOLESTEROLEMIA, MILD   HYPERTENSION, BENIGN ESSENTIAL   IBS (irritable bowel syndrome)   Diverticulitis   Hypokalemia  Acute sigmoid diverticulitis: CT scan was done this will showed results as below, she was started on IV Cipro and Flagyl, she defervesced her white count normalized. She was able to tolerate her oral diet she was changed to oral antibiotics she will continue for a total of 14 days.  Hypokalemia: Repleted orally.  Essential hypertension: No changes were made.  Normocytic anemia: She had a mild drop in her hemoglobin likely hemodilution older was no signs of overt bleeding. She will need further workup as an outpatient by her Gastroententerology's.  Discharge Instructions  Discharge Instructions    Diet - low sodium heart healthy    Complete by:  As directed    Increase activity slowly    Complete by:  As directed      Allergies as of 04/04/2017      Reactions   Ace Inhibitors Cough   sob   Amoxicillin Rash   Ceftin [cefuroxime Axetil] Rash   Penicillins Rash      Medication List    TAKE these medications   albuterol 108 (90 Base) MCG/ACT inhaler Commonly known as:  PROVENTIL HFA;VENTOLIN HFA Inhale 1-2  puffs into the lungs every 6 (six) hours as needed for wheezing or shortness of breath.   ciprofloxacin 500 MG tablet Commonly known as:  CIPRO Take 1 tablet (500 mg total) by mouth 2 (two) times daily.   dicyclomine 20 MG tablet Commonly known as:  BENTYL Take 1 tablet (20 mg total) by mouth 2 (two) times daily as needed (abdominal cramps). What changed:  when to take this   fexofenadine 180 MG tablet Commonly known as:  ALLEGRA Take 180 mg by mouth daily.   fluticasone 50 MCG/ACT nasal spray Commonly known as:  FLONASE Place 1 spray into both nostrils daily as needed for allergies or rhinitis.   hydrochlorothiazide 25 MG tablet Commonly known as:  HYDRODIURIL Take 25 mg by mouth daily.   metroNIDAZOLE 500 MG tablet Commonly known as:  FLAGYL Take 1 tablet (500 mg total) by mouth every 8 (eight) hours.   Polyethyl Glycol-Propyl Glycol 0.4-0.3 % Gel ophthalmic gel Commonly known as:  SYSTANE Place 1 application into both eyes 2 (two) times daily.   polyethylene glycol packet Commonly known as:  MIRALAX Take 17 g by mouth daily.   PRESERVISION AREDS PO Take 1 capsule by mouth daily.       Allergies  Allergen Reactions  . Ace Inhibitors Cough    sob  . Amoxicillin Rash  . Ceftin [Cefuroxime Axetil] Rash  . Penicillins Rash    Consultations: None Procedures/Studies: Dg Chest 2 View  Result Date: 03/31/2017 CLINICAL DATA:  Worsening left-sided flank pain for the past 4 days. EXAM: CHEST  2  VIEW COMPARISON:  07/06/2016; 06/04/2016; chest CT - 06/04/2016 FINDINGS: Grossly unchanged cardiac silhouette and mediastinal contours. There is persistent mild elevation / eventration involving the medial aspect of the right hemidiaphragm. Minimal bibasilar linear heterogeneous opacities favored to represent atelectasis or scar. No focal airspace opacities. No pleural effusion pneumothorax. No evidence of edema. No acute osseus abnormalities. IMPRESSION: Bibasilar atelectasis /  scar without superimposed acute cardiopulmonary disease. Electronically Signed   By: Sandi Mariscal M.D.   On: 03/31/2017 09:53   Ct Abdomen Pelvis W Contrast  Result Date: 03/31/2017 CLINICAL DATA:  Left flank pain worsening over the past 4 days. Nausea. EXAM: CT ABDOMEN AND PELVIS WITH CONTRAST TECHNIQUE: Multidetector CT imaging of the abdomen and pelvis was performed using the standard protocol following bolus administration of intravenous contrast. CONTRAST:  177mL ISOVUE-300 IOPAMIDOL (ISOVUE-300) INJECTION 61% COMPARISON:  Chest CT 06/04/2016 FINDINGS: Lower chest: Evidence of bibasilar atelectasis. Hepatobiliary: Mild diffuse low-attenuation of the liver without focal mass. Gallbladder and biliary tree are within normal. Pancreas: Within normal. Spleen: Within normal. Adrenals/Urinary Tract: Adrenal glands are normal. Kidneys normal size. No hydronephrosis or nephrolithiasis. Subcentimeter hypodensity over the mid upper pole cortex of the left kidney too small to characterize but likely a cyst. Ureters and bladder are normal. Stomach/Bowel: Stomach and small bowel are unremarkable. Appendix is normal. Diverticulosis of the descending and sigmoid colon with inflammatory change adjacent the sigmoid colon in the left lower quadrant likely due to acute diverticulitis. No adenopathy or significant wall thickening. No evidence of perforation or abscess. Small amount of adjacent free fluid in the lower abdomen. Vascular/Lymphatic: Within normal. Reproductive: Previous hysterectomy.  Adnexal regions within normal. Other: Multiple pelvic phleboliths. Musculoskeletal: Minimal degenerate change of the spine. IMPRESSION: Diverticulosis of the distal descending and sigmoid colon with acute diverticulitis of the sigmoid colon in the left lower quadrant. No perforation or abscess. Bibasilar atelectasis. Subcentimeter left renal cortical hypodensity too small to characterize, but likely a cyst. Electronically Signed   By:  Marin Olp M.D.   On: 03/31/2017 10:42   (Echo, Carotid, EGD, Colonoscopy, ERCP)    Subjective:   Discharge Exam: Vitals:   04/04/17 0441 04/04/17 0753  BP: (!) 142/69   Pulse: 77   Resp: 20   Temp: 98.1 F (36.7 C)   SpO2: 99% 98%   Vitals:   04/03/17 2253 04/03/17 2315 04/04/17 0441 04/04/17 0753  BP:   (!) 142/69   Pulse:   77   Resp: (!) 24 (!) 22 20   Temp:   98.1 F (36.7 C)   TempSrc:   Oral   SpO2:  97% 99% 98%  Weight:      Height:        General: Patient is awake alert oriented 3 no acute distress Cardiovascular: Regular rate and rhythm with positive S1-S2. Respiratory: Good air movement clear to auscultation. Abdominal: Bowel sounds soft nontender nondistended. Extremities: No edema or cyanosis.    The results of significant diagnostics from this hospitalization (including imaging, microbiology, ancillary and laboratory) are listed below for reference.     Microbiology: Recent Results (from the past 240 hour(s))  Urine culture     Status: Abnormal   Collection Time: 03/31/17  8:12 AM  Result Value Ref Range Status   Specimen Description URINE, CLEAN CATCH  Final   Special Requests NONE  Final   Culture MULTIPLE SPECIES PRESENT, SUGGEST RECOLLECTION (A)  Final   Report Status 04/01/2017 FINAL  Final     Labs: BNP (  last 3 results) No results for input(s): BNP in the last 8760 hours. Basic Metabolic Panel:  Recent Labs Lab 03/31/17 0855 04/01/17 0623 04/02/17 0552 04/03/17 0552 04/04/17 0527  NA 141 143 140 143 142  K 3.3* 3.3* 3.3* 3.4* 3.5  CL 106 109 109 111 110  CO2 26 25 25 25 24   GLUCOSE 112* 105* 99 92 100*  BUN 16 14 8 6 6   CREATININE 0.79 0.91 0.81 0.72 0.84  CALCIUM 9.2 8.5* 8.4* 8.5* 9.0  MG  --  1.9 1.9  --  1.8   Liver Function Tests:  Recent Labs Lab 03/31/17 0855  AST 23  ALT 22  ALKPHOS 71  BILITOT 0.6  PROT 7.3  ALBUMIN 4.2    Recent Labs Lab 03/31/17 0855  LIPASE 24   No results for input(s):  AMMONIA in the last 168 hours. CBC:  Recent Labs Lab 03/31/17 0855 04/01/17 0623 04/02/17 0552 04/03/17 0552 04/04/17 0527  WBC 17.8* 17.2* 14.4* 8.5 8.4  NEUTROABS 15.9*  --   --   --   --   HGB 13.9 12.3 10.9* 10.8* 11.6*  HCT 41.8 37.2 33.3* 33.3* 35.6*  MCV 77.8* 77.5* 78.5 76.4* 77.7*  PLT 332 312 277 273 340   Cardiac Enzymes: No results for input(s): CKTOTAL, CKMB, CKMBINDEX, TROPONINI in the last 168 hours. BNP: Invalid input(s): POCBNP CBG: No results for input(s): GLUCAP in the last 168 hours. D-Dimer No results for input(s): DDIMER in the last 72 hours. Hgb A1c No results for input(s): HGBA1C in the last 72 hours. Lipid Profile No results for input(s): CHOL, HDL, LDLCALC, TRIG, CHOLHDL, LDLDIRECT in the last 72 hours. Thyroid function studies No results for input(s): TSH, T4TOTAL, T3FREE, THYROIDAB in the last 72 hours.  Invalid input(s): FREET3 Anemia work up No results for input(s): VITAMINB12, FOLATE, FERRITIN, TIBC, IRON, RETICCTPCT in the last 72 hours. Urinalysis    Component Value Date/Time   COLORURINE YELLOW 03/31/2017 0742   APPEARANCEUR CLEAR 03/31/2017 0742   LABSPEC 1.023 03/31/2017 0742   PHURINE 5.0 03/31/2017 0742   GLUCOSEU NEGATIVE 03/31/2017 0742   HGBUR NEGATIVE 03/31/2017 0742   HGBUR negative 11/18/2009 0811   BILIRUBINUR NEGATIVE 03/31/2017 0742   KETONESUR 5 (A) 03/31/2017 0742   PROTEINUR NEGATIVE 03/31/2017 0742   UROBILINOGEN 1.0 06/29/2013 0040   NITRITE NEGATIVE 03/31/2017 0742   LEUKOCYTESUR NEGATIVE 03/31/2017 0742   Sepsis Labs Invalid input(s): PROCALCITONIN,  WBC,  LACTICIDVEN Microbiology Recent Results (from the past 240 hour(s))  Urine culture     Status: Abnormal   Collection Time: 03/31/17  8:12 AM  Result Value Ref Range Status   Specimen Description URINE, CLEAN CATCH  Final   Special Requests NONE  Final   Culture MULTIPLE SPECIES PRESENT, SUGGEST RECOLLECTION (A)  Final   Report Status 04/01/2017 FINAL   Final     Time coordinating discharge: Over 30 minutes  SIGNED:   Charlynne Cousins, MD  [facility] 04/04/2017, 10:53 AM Pager   If 7PM-7AM, please contact night-coverage [email or web address] Password [email or web address]

## 2017-04-04 NOTE — Progress Notes (Signed)
Date: April 04, 2017 Chart reviewed for discharge orders: None found for case management. Vernia Buff, [phone]

## 2017-04-04 NOTE — Care Management Important Message (Signed)
Important Message  Patient Details  Name: KASY IANNACONE MRN: [id number] Date of Birth: 1946-07-15   Medicare Important Message Given:  Yes    Kerin Salen 04/04/2017, 11:18 AMImportant Message  Patient Details  Name: NYIAH PIANKA MRN: [id number] Date of Birth: 1946-07-27   Medicare Important Message Given:  Yes    Kerin Salen 04/04/2017, 11:18 AM

## 2017-04-13 ENCOUNTER — Other Ambulatory Visit: Payer: Self-pay | Admitting: Family Medicine

## 2017-04-13 DIAGNOSIS — R5381 Other malaise: Secondary | ICD-10-CM

## 2017-05-03 ENCOUNTER — Other Ambulatory Visit: Payer: Self-pay | Admitting: Family Medicine

## 2017-05-03 DIAGNOSIS — E2839 Other primary ovarian failure: Secondary | ICD-10-CM

## 2017-06-04 ENCOUNTER — Ambulatory Visit: Payer: Medicare HMO

## 2017-06-26 ENCOUNTER — Ambulatory Visit (INDEPENDENT_AMBULATORY_CARE_PROVIDER_SITE_OTHER): Payer: Medicare HMO | Admitting: Neurology

## 2017-06-26 ENCOUNTER — Encounter: Payer: Self-pay | Admitting: Neurology

## 2017-06-26 VITALS — BP 123/76 | HR 76 | Ht 67.0 in | Wt 172.0 lb

## 2017-06-26 DIAGNOSIS — R413 Other amnesia: Secondary | ICD-10-CM | POA: Diagnosis not present
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
## 2017-06-29 ENCOUNTER — Ambulatory Visit
Admission: RE | Admit: 2017-06-29 | Discharge: 2017-06-29 | Disposition: A | Discharge status: Home / Self Care | Payer: Medicare HMO | Source: Ambulatory Visit | Attending: Family Medicine | Admitting: Family Medicine

## 2017-06-29 DIAGNOSIS — Z1231 Encounter for screening mammogram for malignant neoplasm of breast: Secondary | ICD-10-CM

## 2017-06-29 DIAGNOSIS — E2839 Other primary ovarian failure: Secondary | ICD-10-CM

## 2018-05-28 ENCOUNTER — Other Ambulatory Visit: Payer: Self-pay | Admitting: Physician Assistant

## 2018-05-28 ENCOUNTER — Other Ambulatory Visit: Payer: Self-pay | Admitting: Family Medicine

## 2018-05-28 DIAGNOSIS — Z1231 Encounter for screening mammogram for malignant neoplasm of breast: Secondary | ICD-10-CM

## 2018-07-01 ENCOUNTER — Ambulatory Visit: Payer: Medicare HMO

## 2018-07-24 ENCOUNTER — Ambulatory Visit
Admission: RE | Admit: 2018-07-24 | Discharge: 2018-07-24 | Disposition: A | Discharge status: Home / Self Care | Payer: Medicare Other | Source: Ambulatory Visit | Attending: Physician Assistant | Admitting: Physician Assistant

## 2018-07-24 DIAGNOSIS — Z1231 Encounter for screening mammogram for malignant neoplasm of breast: Secondary | ICD-10-CM

## 2019-07-22 ENCOUNTER — Other Ambulatory Visit: Payer: Self-pay | Admitting: Physician Assistant

## 2019-07-22 DIAGNOSIS — Z1231 Encounter for screening mammogram for malignant neoplasm of breast: Secondary | ICD-10-CM

## 2019-09-11 ENCOUNTER — Ambulatory Visit
Admission: RE | Admit: 2019-09-11 | Discharge: 2019-09-11 | Disposition: A | Discharge status: Home / Self Care | Payer: Medicare Other | Source: Ambulatory Visit | Attending: Physician Assistant | Admitting: Physician Assistant

## 2019-09-11 ENCOUNTER — Other Ambulatory Visit: Payer: Self-pay

## 2019-09-11 DIAGNOSIS — Z1231 Encounter for screening mammogram for malignant neoplasm of breast: Secondary | ICD-10-CM

## 2019-12-16 ENCOUNTER — Other Ambulatory Visit: Payer: Self-pay | Admitting: Physician Assistant

## 2019-12-16 DIAGNOSIS — M8580 Other specified disorders of bone density and structure, unspecified site: Secondary | ICD-10-CM

## 2020-01-30 ENCOUNTER — Ambulatory Visit
Admission: RE | Admit: 2020-01-30 | Discharge: 2020-01-30 | Disposition: A | Discharge status: Home / Self Care | Payer: Medicare Other | Source: Ambulatory Visit | Attending: Physician Assistant | Admitting: Physician Assistant

## 2020-01-30 ENCOUNTER — Other Ambulatory Visit: Payer: Self-pay

## 2020-01-30 DIAGNOSIS — M8580 Other specified disorders of bone density and structure, unspecified site: Secondary | ICD-10-CM

## 2020-10-22 ENCOUNTER — Other Ambulatory Visit: Payer: Self-pay | Admitting: Physician Assistant

## 2020-10-22 DIAGNOSIS — Z1231 Encounter for screening mammogram for malignant neoplasm of breast: Secondary | ICD-10-CM

## 2020-12-15 ENCOUNTER — Ambulatory Visit
Admission: RE | Admit: 2020-12-15 | Discharge: 2020-12-15 | Disposition: A | Discharge status: Home / Self Care | Payer: Medicare Other | Source: Ambulatory Visit | Attending: Physician Assistant | Admitting: Physician Assistant

## 2020-12-15 ENCOUNTER — Other Ambulatory Visit: Payer: Self-pay

## 2020-12-15 DIAGNOSIS — Z1231 Encounter for screening mammogram for malignant neoplasm of breast: Secondary | ICD-10-CM

## 2020-12-17 ENCOUNTER — Other Ambulatory Visit: Payer: Self-pay | Admitting: Physician Assistant

## 2020-12-17 DIAGNOSIS — R928 Other abnormal and inconclusive findings on diagnostic imaging of breast: Secondary | ICD-10-CM

## 2021-01-11 ENCOUNTER — Other Ambulatory Visit: Payer: Self-pay

## 2021-01-11 ENCOUNTER — Ambulatory Visit
Admission: RE | Admit: 2021-01-11 | Discharge: 2021-01-11 | Disposition: A | Discharge status: Home / Self Care | Payer: Medicare Other | Source: Ambulatory Visit | Attending: Physician Assistant | Admitting: Physician Assistant

## 2021-01-11 ENCOUNTER — Ambulatory Visit: Admission: RE | Admit: 2021-01-11 | Payer: Medicare Other | Source: Ambulatory Visit

## 2021-01-11 DIAGNOSIS — R928 Other abnormal and inconclusive findings on diagnostic imaging of breast: Secondary | ICD-10-CM

## 2021-03-08 ENCOUNTER — Ambulatory Visit: Payer: Medicare Other | Admitting: Neurology

## 2021-03-10 ENCOUNTER — Encounter: Payer: Self-pay | Admitting: Neurology

## 2021-03-10 ENCOUNTER — Ambulatory Visit (INDEPENDENT_AMBULATORY_CARE_PROVIDER_SITE_OTHER): Payer: Medicare Other | Admitting: Neurology

## 2021-03-10 VITALS — BP 142/90 | HR 81 | Ht 65.0 in | Wt 190.0 lb

## 2021-03-10 DIAGNOSIS — F0393 Unspecified dementia, unspecified severity, with mood disturbance: Secondary | ICD-10-CM | POA: Insufficient documentation

## 2021-03-10 DIAGNOSIS — F0390 Unspecified dementia without behavioral disturbance: Secondary | ICD-10-CM | POA: Diagnosis not present

## 2021-03-10 DIAGNOSIS — G910 Communicating hydrocephalus: Secondary | ICD-10-CM | POA: Diagnosis not present

## 2021-03-10 NOTE — Progress Notes (Signed)
Chief Complaint  Patient presents with   New Patient (Initial Visit)    New Patient:  Memory Room 37, sister Robin in room      ASSESSMENT AND PLAN  Emily Brown is a 75 y.o. female   Noncommunicating hydrocephalus Slow worsening dementia  Referred to home health for medication management, including social worker,    DIAGNOSTIC DATA (LABS, IMAGING, TESTING) - I reviewed patient records, labs, notes, testing and imaging myself where available. I reviewed the laboratory evaluation from [facility] in April 2022, normal CMP, creatinine 0.86, CBC, hemoglobin of 13, lipid panel, cholesterol was 227, LDL 153, vitamin D 29, J28 786, normal folic acid 20, parathyroid hormone 51, TSH 2.0  MEDICAL HISTORY:  Emily Brown is a 75 year old female, seen in request by her primary care PA Mindi Curling, completed by her sister Shirlean Mylar, evaluation of worsening dementia on March 10, 2021  I saw her previously in 2018, that was accompanied by her Jens Som, she is one of the 12 siblings,  She has always been a little slow since childhood, was helped back feel grades in school, eventually completed seventh grade at age 10,  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 All abnormality looked chronic, no new lesions. Laboratory evaluation which then showed no significant abnormality, including normal TSH, B12,  Over the years, she had slow  decline, Mini-Mental Status Examination was 27/30 at last visit in 2018, today is 19 out of 30, she still lives alone, spends most of the time watching TV, no longer cooking, her Sister Shirlean Mylar would bring her to her place every night, she is scared to stay alone,  She has no trouble walking, still able to bathe, dress, tolerating without difficulty  Her family hope to get more including home care with her decline functional status, increased confusion,   PHYSICAL EXAM:   Vitals:   03/10/21 0921  BP: (!) 142/90  Pulse: 81  Weight: 190 lb (86.2 kg)  Height: 5\' 5"  (1.651 m)   Not recorded     Body mass index is 31.62 kg/m.  PHYSICAL EXAMNIATION:  Gen: NAD, conversant, well nourised, well groomed                     Cardiovascular: Regular rate rhythm, no peripheral edema, warm, nontender. Eyes: Conjunctivae clear without exudates or hemorrhage Neck: Supple, no carotid bruits. Pulmonary: Clear to auscultation bilaterally   NEUROLOGICAL EXAM:  MENTAL STATUS: Speech:    Speech is normal; fluent and spontaneous with normal comprehension.  Pleasant cooperative Cognition:   MMSE - Mini Mental State Exam 03/10/2021 06/26/2017 03/28/2016  Orientation to time 3 5 3   Orientation to Place 5 5 5   Registration 3 3 3   Attention/ Calculation 0 5 5  Recall 0 0 0  Language- name 2 objects 2 2 2   Language- repeat 1 1 1   Language- follow 3 step command 3 3 3   Language- read & follow direction 1  1 1  Write a sentence 0 1 1  Copy design 1 1 1   Total score 19 27 25       CRANIAL NERVES: CN II: Visual fields are full to confrontation. Pupils are round equal and briskly reactive to light. CN III, IV, VI: extraocular movement are normal. No ptosis. CN V: Facial sensation is intact to light touch CN VII: Face is symmetric with normal eye closure  CN VIII: Hearing is normal to causal conversation. CN IX, X: Phonation is normal. CN XI: Head turning and shoulder shrug are  intact  MOTOR: There is no pronator drift of out-stretched arms. Muscle bulk and tone are normal. Muscle strength is normal.  REFLEXES: Reflexes are 2+ and symmetric at the biceps, triceps, knees, and ankles. Plantar responses are flexor.  SENSORY: Intact to light touch, pinprick and vibratory sensation are intact in fingers and toes.  COORDINATION: There is no trunk or limb dysmetria noted.  GAIT/STANCE: Posture is normal. Gait is steady  REVIEW OF SYSTEMS:  Full 14 system review of systems performed and notable only for as above All other review of systems were negative.   ALLERGIES: Allergies  Allergen Reactions   Ace Inhibitors Cough    sob   Amoxicillin Rash   Ceftin [Cefuroxime Axetil] Rash   Penicillins Rash    HOME MEDICATIONS: Current Outpatient Medications  Medication Sig Dispense Refill   albuterol (PROVENTIL HFA;VENTOLIN HFA) 108 (90 Base) MCG/ACT inhaler Inhale 1-2 puffs into the lungs every 6 (six) hours as needed for wheezing or shortness of breath. 1 Inhaler 0   dicyclomine (BENTYL) 20 MG tablet Take 1 tablet (20 mg total) by mouth 2 (two) times daily as needed (abdominal cramps). 20 tablet 0   fluticasone (FLONASE) 50 MCG/ACT nasal spray Place 1 spray into both nostrils daily as needed for allergies or rhinitis.     hydrochlorothiazide (HYDRODIURIL) 25 MG tablet Take 25 mg by mouth daily.     Multiple Vitamins-Minerals (PRESERVISION AREDS PO) Take 1 capsule by mouth daily.      Polyethyl Glycol-Propyl Glycol (SYSTANE) 0.4-0.3 % GEL ophthalmic gel Place 1 application into both eyes 2 (two) times daily.     polyethylene glycol (MIRALAX) packet Take 17 g by mouth daily. 14 each 0   fexofenadine (ALLEGRA) 180 MG tablet Take 180 mg as needed by mouth.      No current facility-administered medications for this visit.    PAST MEDICAL HISTORY: Past Medical History:  Diagnosis Date   Hypertension    IBS (irritable bowel syndrome)    Liver disease    Memory  loss    NPH (normal pressure hydrocephalus) (HCC)    Thyroid cyst     PAST SURGICAL HISTORY: Past Surgical History:  Procedure Laterality Date   left breast tumor removal     PARTIAL HYSTERECTOMY      FAMILY HISTORY: Family History  Problem Relation Age of Onset   Diabetes Mother    Kidney disease Father    Breast cancer Sister     SOCIAL HISTORY: Social History   Socioeconomic History   Marital status: Single    Spouse name: Not on file   Number of children: 0   Years of education: 7 th   Highest education level: Not on file  Occupational History    Comment: Retired   Tobacco Use   Smoking status: Never   Smokeless tobacco: Never  Substance and Sexual Activity   Alcohol use: No    Alcohol/week: 0.0  standard drinks   Drug use: No   Sexual activity: Not on file  Other Topics Concern   Not on file  Social History Narrative   Patient lives at home alone but stays with Sister Shirlean Mylar at night   Left handed.   Drinks 1 cup caffeine occassionally   Social Determinants of Health   Financial Resource Strain: Not on file  Food Insecurity: Not on file  Transportation Needs: Not on file  Physical Activity: Not on file  Stress: Not on file  Social Connections: Not on file  Intimate Partner Violence: Not on file      Marcial Pacas, M.D. Ph.D.  [facility] [address] [place], [place] [address] Ph: [phone] Fax: [phone]  CC:  Mindi Curling, PA-C [place],   [id number]  [facility], [place]

## 2021-12-05 IMAGING — MG MM DIGITAL DIAGNOSTIC UNILAT*L* W/ TOMO W/ CAD
6 of 12 series · 6 of 36 positions shown · non-contrast
Comparison: Previous exam(s).

CLINICAL DATA: Patient recalled from screening for left breast mass
and possible distortion.

EXAM:
DIGITAL DIAGNOSTIC UNILATERAL LEFT MAMMOGRAM WITH TOMOSYNTHESIS AND
CAD
TECHNIQUE: Left digital diagnostic mammography and breast tomosynthesis was
performed. The images were evaluated with computer-aided detection.

[L CC synth-2D (1 of 3)]
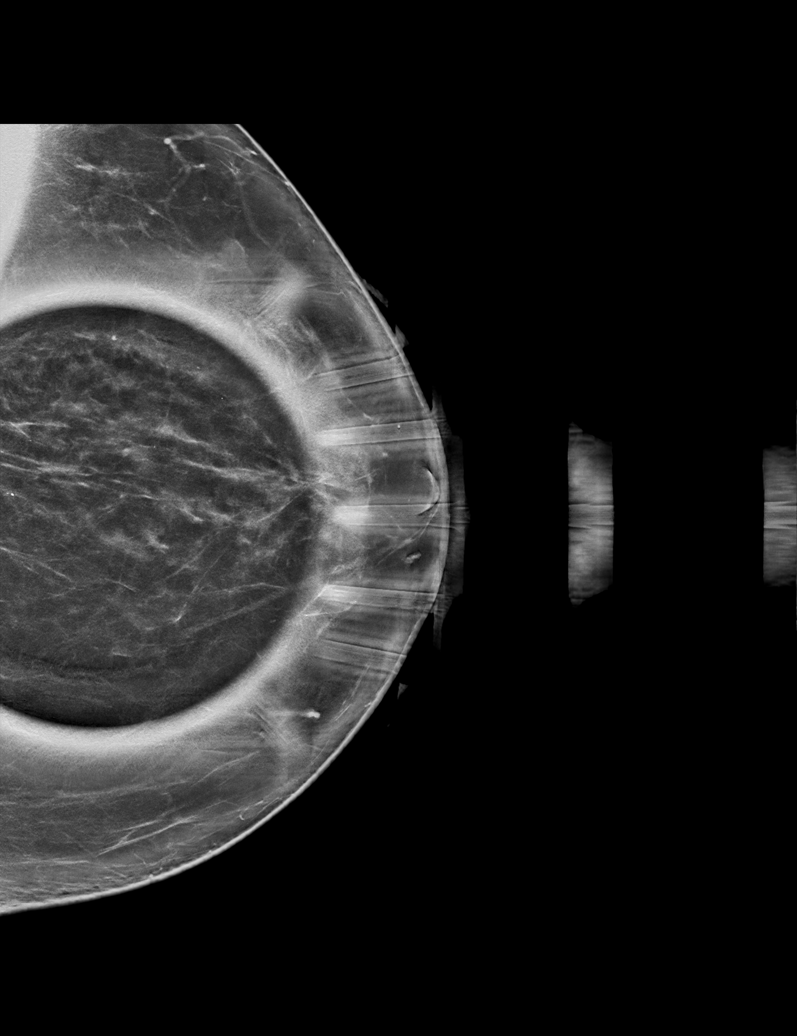

[L MLO synth-2D (1 of 2)]
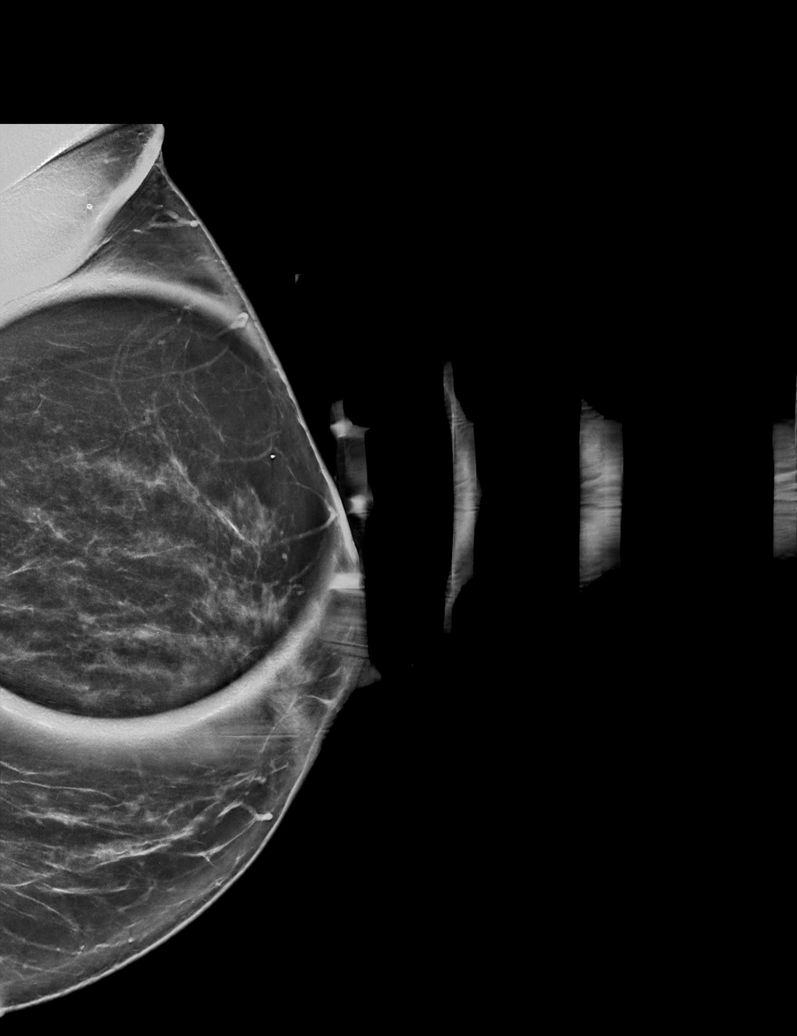

[L MLO synth-2D (2 of 2)]
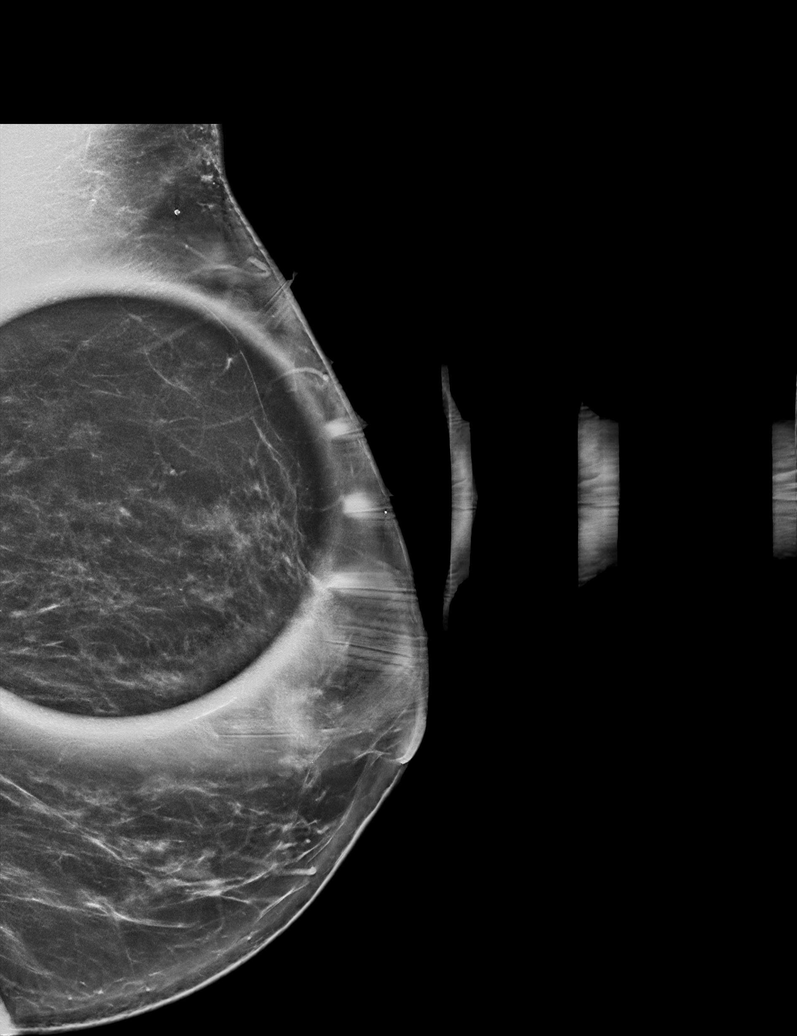

[L CC synth-2D (2 of 3)]
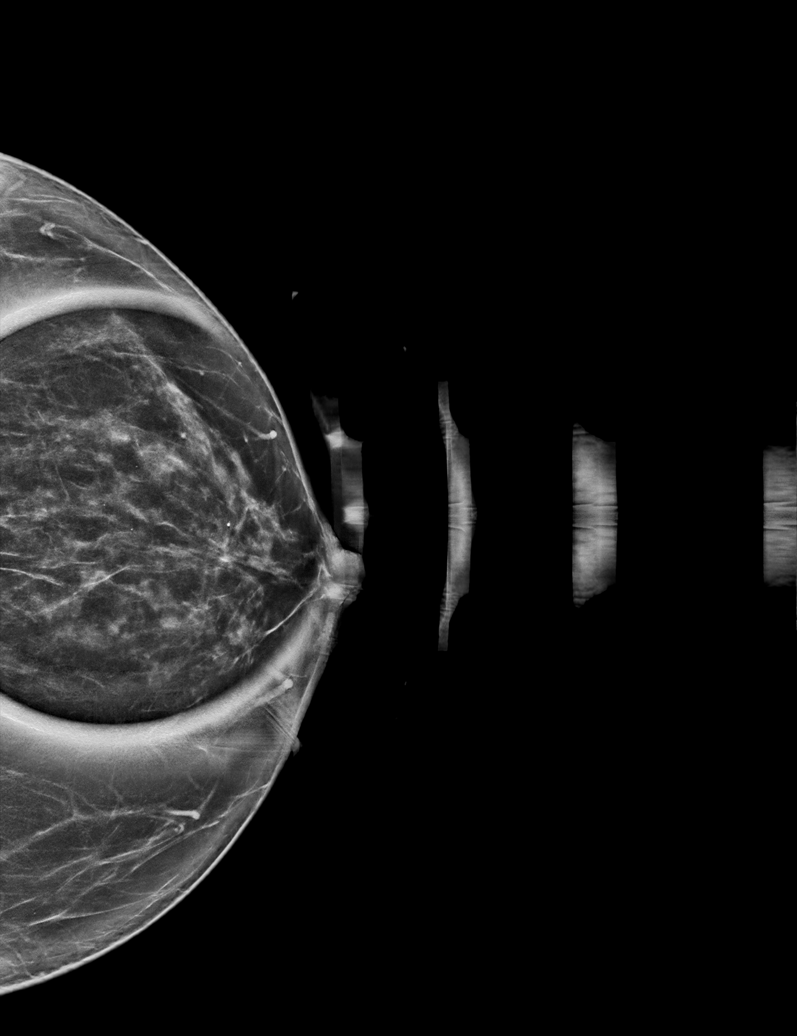

[L ML synth-2D]
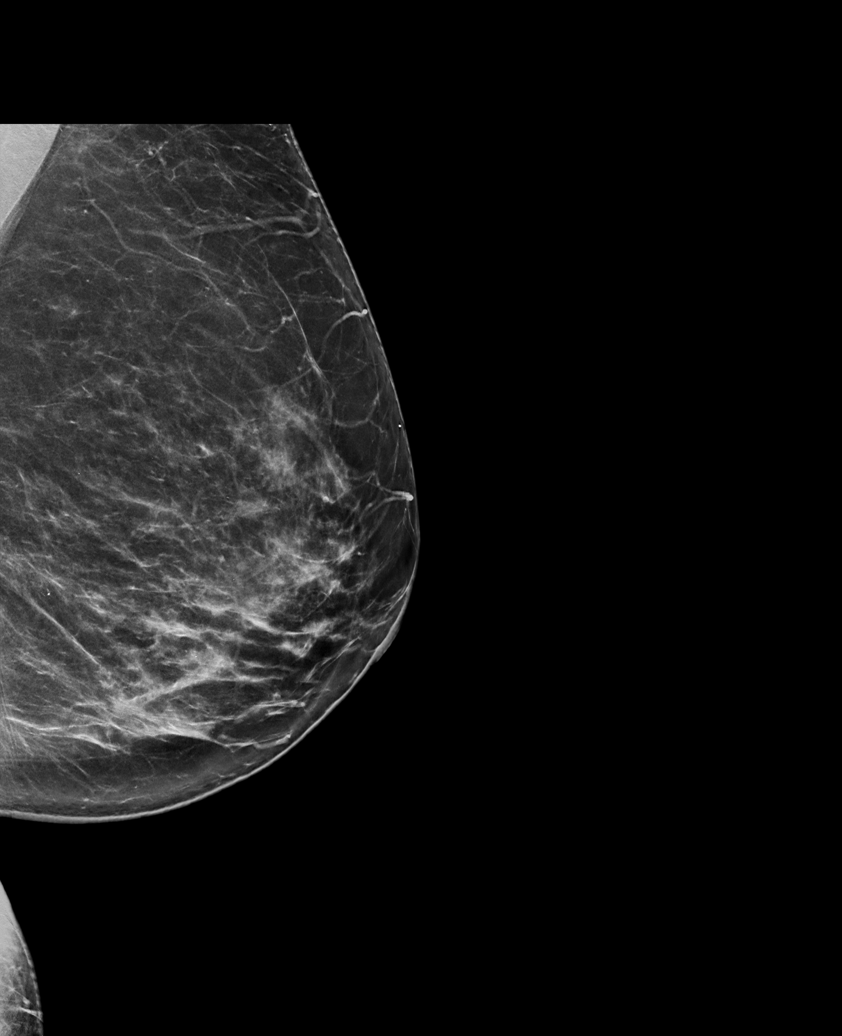

[L CC synth-2D (3 of 3)]
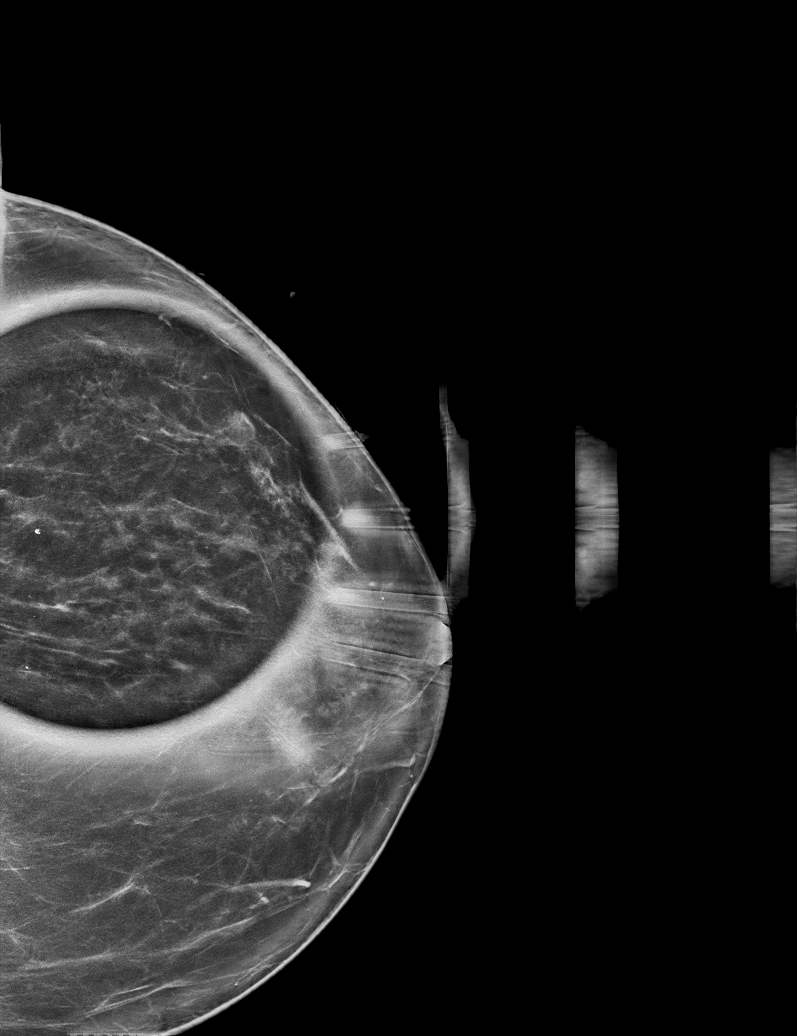

[6 of 36 positions shown; findings below may reference images not displayed]

ACR Breast Density Category c: The breast tissue is heterogeneously
dense, which may obscure small masses.
FINDINGS: Additional imaging of the left breast demonstrates the questioned
findings to resolve with additional imaging suggestive of dense
fibroglandular tissue. No suspicious findings on additional imaging.
IMPRESSION: No mammographic evidence for malignancy.

RECOMMENDATION:
Screening mammogram in one year.(Code:[id number])

I have discussed the findings and recommendations with the patient.
If applicable, a reminder letter will be sent to the patient
regarding the next appointment.

BI-RADS CATEGORY  2: Benign.

## 2021-12-09 ENCOUNTER — Encounter: Payer: Self-pay | Admitting: Podiatry

## 2021-12-09 ENCOUNTER — Ambulatory Visit (INDEPENDENT_AMBULATORY_CARE_PROVIDER_SITE_OTHER): Payer: Medicare Other | Admitting: Podiatry

## 2021-12-09 DIAGNOSIS — M79675 Pain in left toe(s): Secondary | ICD-10-CM

## 2021-12-09 DIAGNOSIS — M79674 Pain in right toe(s): Secondary | ICD-10-CM

## 2021-12-09 DIAGNOSIS — B351 Tinea unguium: Secondary | ICD-10-CM

## 2021-12-09 NOTE — Progress Notes (Signed)

## 2021-12-23 ENCOUNTER — Other Ambulatory Visit: Payer: Self-pay | Admitting: Physician Assistant

## 2021-12-23 DIAGNOSIS — Z1231 Encounter for screening mammogram for malignant neoplasm of breast: Secondary | ICD-10-CM

## 2022-01-05 ENCOUNTER — Ambulatory Visit: Payer: Medicare Other

## 2022-01-05 ENCOUNTER — Ambulatory Visit
Admission: RE | Admit: 2022-01-05 | Discharge: 2022-01-05 | Disposition: A | Discharge status: Home / Self Care | Payer: Medicare Other | Source: Ambulatory Visit | Attending: Physician Assistant | Admitting: Physician Assistant

## 2022-01-05 DIAGNOSIS — Z1231 Encounter for screening mammogram for malignant neoplasm of breast: Secondary | ICD-10-CM

## 2022-04-10 ENCOUNTER — Ambulatory Visit: Payer: Medicare Other | Admitting: Podiatry

## 2022-04-17 ENCOUNTER — Encounter: Payer: Self-pay | Admitting: Podiatry

## 2022-04-17 ENCOUNTER — Ambulatory Visit (INDEPENDENT_AMBULATORY_CARE_PROVIDER_SITE_OTHER): Payer: Medicare Other | Admitting: Podiatry

## 2022-04-17 DIAGNOSIS — M79675 Pain in left toe(s): Secondary | ICD-10-CM

## 2022-04-17 DIAGNOSIS — B351 Tinea unguium: Secondary | ICD-10-CM

## 2022-04-17 DIAGNOSIS — F0390 Unspecified dementia without behavioral disturbance: Secondary | ICD-10-CM

## 2022-04-17 DIAGNOSIS — M79674 Pain in right toe(s): Secondary | ICD-10-CM

## 2022-04-17 NOTE — Progress Notes (Signed)

## 2022-07-19 ENCOUNTER — Ambulatory Visit: Payer: Medicare Other | Admitting: Podiatry

## 2022-09-05 ENCOUNTER — Encounter: Payer: Self-pay | Admitting: Neurology

## 2022-09-05 ENCOUNTER — Ambulatory Visit (INDEPENDENT_AMBULATORY_CARE_PROVIDER_SITE_OTHER): Payer: 59 | Admitting: Neurology

## 2022-09-05 VITALS — BP 141/75 | HR 68 | Ht 67.0 in | Wt 175.6 lb

## 2022-09-05 DIAGNOSIS — G910 Communicating hydrocephalus: Secondary | ICD-10-CM | POA: Diagnosis not present

## 2022-09-05 DIAGNOSIS — F0393 Unspecified dementia, unspecified severity, with mood disturbance: Secondary | ICD-10-CM | POA: Diagnosis not present

## 2022-09-05 NOTE — Progress Notes (Signed)
Chief Complaint  Patient presents with   Memory Loss    Rm 16. Patient with sister, sister states she has noticed some slight worsening in memory since last visit. Patient states she is doing well otherwise.       ASSESSMENT AND PLAN  Emily Brown is a 77 y.o. female   Noncommunicating hydrocephalus Slow worsening dementia  Mini-Mental Status Examination 17/30, 2 point drop compared with July 2022,  Laboratory evaluation to rule out treatable etiology   Personally reviewed CT head in November 2016, chronic marked dilatation of the lateral ventricle, particularly atrium and occipital horn,  DIAGNOSTIC DATA (LABS, IMAGING, TESTING) - I reviewed patient records, labs, notes, testing and imaging myself where available. I reviewed the laboratory evaluation from [facility] in April 2022, normal CMP, creatinine 0.86, CBC, hemoglobin of 13, lipid panel, cholesterol was 227, LDL 153, vitamin D 29, K09 381, normal folic acid 20, parathyroid hormone 51, TSH 2.0  MEDICAL HISTORY:  Emily Brown is a 77 year old female, seen in request by her primary care PA Mindi Curling, completed by her sister Shirlean Mylar, evaluation of worsening dementia on March 10, 2021  I saw her previously in 2018, that was accompanied by her Jens Som, she is one of the 12 siblings,  She has always been a little slow since childhood, was helped back feel grades in school, eventually completed seventh grade at age 63,  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 All abnormality looked chronic, no new lesions. Laboratory evaluation which then showed no significant abnormality, including normal TSH, B12,  Over the years, she had slow decline, Mini-Mental Status Examination was 27/30 at last visit in 2018, today is 19 out of 30, she still lives alone, spends most of the time watching TV, no longer cooking, her Sister Shirlean Mylar would bring her to her place every night, she is scared to stay alone,  She has no trouble walking, still able to bathe, dress, tolerating without difficulty  Her family hope to get more including home care with her decline functional status, increased confusion,  Update September 05, 2022: She is with her Evorn Gong at today's clinical visit, reports worsening over the past few months, occasional visual hallucinations, do not feel comfortable to be left alone at evening time.   Still independent during the day, walk with her sister tomorrow without much difficulty, does make heavy breathing sound, went through cardiac and pulmonary workup without significant abnormality found,  PHYSICAL EXAM:   Vitals:   09/05/22 0954  BP: (!) 141/75  Pulse: 68  Weight: 175 lb 9.6 oz (79.7 kg)  Height: '5\' 7"'$  (1.702 m)       Body mass index is 27.5 kg/m.  PHYSICAL EXAMNIATION:  Gen: NAD, conversant, well nourised, well groomed                     Cardiovascular: Regular rate rhythm, no peripheral edema, warm, nontender. Eyes: Conjunctivae clear without exudates  or hemorrhage Neck: Supple, no carotid bruits. Pulmonary: Clear to auscultation bilaterally   NEUROLOGICAL EXAM:  MENTAL STATUS: Speech:    Speech is normal; fluent and spontaneous with normal comprehension.  Pleasant cooperative Cognition:      09/05/2022    9:56 AM 03/10/2021    9:25 AM 06/26/2017   12:15 PM  MMSE - Mini Mental State Exam  Orientation to time '3 3 5   '$ Orientation to Place '3 5 5  '$ Registration '1 3 3  '$ Attention/ Calculation 3 0 5  Recall 0 0 0  Language- name 2 objects '2 2 2  '$ Language- repeat '1 1 1  '$ Language- follow 3 step command '3 3 3  '$ Language- read & follow direction '1 1 1  '$ Write a sentence 0 0 1  Copy design 0 1 1  Total score '17 19 27      '$ CRANIAL NERVES: CN II: Visual fields are full to confrontation. Pupils are round equal and briskly reactive to light. CN III, IV, VI: extraocular movement are normal. No ptosis. CN V: Facial sensation is intact to light touch CN VII: Face is symmetric with normal eye closure  CN VIII: Hearing is normal to causal conversation. CN IX, X: Phonation is normal. CN XI: Head turning and shoulder shrug are intact  MOTOR: There is no pronator drift of out-stretched arms. Muscle bulk and tone are normal. Muscle strength is normal.  REFLEXES: Reflexes are 2+ and symmetric at the biceps, triceps, knees, and ankles. Plantar responses are flexor.  SENSORY: Intact to light touch, pinprick and vibratory sensation are intact in fingers and toes.  COORDINATION: There is no trunk or limb dysmetria noted.  GAIT/STANCE: Need to push up,  Gait is steady   REVIEW OF SYSTEMS:  Full 14 system review of systems performed and notable only for as above All other review of systems were negative.   ALLERGIES: Allergies  Allergen Reactions   Dicyclomine Rash    Rash and itching   Ace Inhibitors Cough    sob   Amoxicillin Rash   Ceftin [Cefuroxime Axetil] Rash   Penicillins Rash    HOME MEDICATIONS: Current Outpatient Medications  Medication Sig Dispense Refill   albuterol (PROVENTIL HFA;VENTOLIN HFA) 108 (90 Base) MCG/ACT inhaler Inhale 1-2 puffs into the lungs every 6 (six) hours as needed for wheezing or shortness of breath. 1 Inhaler 0   fluticasone (FLONASE) 50 MCG/ACT nasal spray Place 1 spray into both nostrils daily as needed for allergies or rhinitis.     hydrochlorothiazide  (HYDRODIURIL) 25 MG tablet Take 25 mg by mouth daily.     Latanoprostene Bunod (VYZULTA) 0.024 % SOLN Apply 1 drop to eye daily.     Multiple Vitamins-Minerals (PRESERVISION AREDS PO) Take 1 capsule by mouth daily.      Polyethyl Glycol-Propyl Glycol (SYSTANE) 0.4-0.3 % GEL ophthalmic gel Place 1 application into both eyes 2 (two) times daily.     polyethylene glycol (MIRALAX) packet Take 17 g by mouth daily. 14 each 0   rosuvastatin (CRESTOR) 5 MG tablet Take 5 mg by mouth every other day.     No current facility-administered medications for this visit.    PAST MEDICAL HISTORY: Past Medical History:  Diagnosis Date   Hypertension    IBS (irritable bowel syndrome)    Liver disease    Memory loss    NPH (normal pressure hydrocephalus) (HCC)    Thyroid cyst     PAST SURGICAL HISTORY: Past Surgical History:  Procedure  Laterality Date   left breast tumor removal     PARTIAL HYSTERECTOMY      FAMILY HISTORY: Family History  Problem Relation Age of Onset   Diabetes Mother    Kidney disease Father    Breast cancer Sister     SOCIAL HISTORY: Social History   Socioeconomic History   Marital status: Single    Spouse name: Not on file   Number of children: 0   Years of education: 7 th   Highest education level: Not on file  Occupational History    Comment: Retired   Tobacco Use   Smoking status: Never   Smokeless tobacco: Never  Substance and Sexual Activity   Alcohol use: No    Alcohol/week: 0.0 standard drinks of alcohol   Drug use: No   Sexual activity: Not on file  Other Topics Concern   Not on file  Social History Narrative   Patient lives at home alone but stays with Sister Shirlean Mylar at night   Left handed.   Drinks 1 cup caffeine occassionally   Social Determinants of Health   Financial Resource Strain: Not on file  Food Insecurity: Not on file  Transportation Needs: Not on file  Physical Activity: Not on file  Stress: Not on file  Social Connections: Not  on file  Intimate Partner Violence: Not on file      Marcial Pacas, M.D. Ph.D.  [facility] [address] [place], [place] [address] Ph: [phone] Fax: [phone]  CC:  [facility] [place] [address] [place],  [place] [address]  [facility], [place]

## 2022-09-09 LAB — COMPREHENSIVE METABOLIC PANEL
ALT: 20 IU/L (ref 0–32)
AST: 20 IU/L (ref 0–40)
Albumin/Globulin Ratio: 1.7 (ref 1.2–2.2)
Albumin: 4.5 g/dL (ref 3.8–4.8)
Alkaline Phosphatase: 97 IU/L (ref 44–121)
BUN/Creatinine Ratio: 17 (ref 12–28)
BUN: 13 mg/dL (ref 8–27)
Bilirubin Total: 0.4 mg/dL (ref 0.0–1.2)
CO2: 26 mmol/L (ref 20–29)
Calcium: 10.6 mg/dL — ABNORMAL HIGH (ref 8.7–10.3)
Chloride: 103 mmol/L (ref 96–106)
Creatinine, Ser: 0.78 mg/dL (ref 0.57–1.00)
Globulin, Total: 2.7 g/dL (ref 1.5–4.5)
Glucose: 84 mg/dL (ref 70–99)
Potassium: 4.5 mmol/L (ref 3.5–5.2)
Sodium: 146 mmol/L — ABNORMAL HIGH (ref 134–144)
Total Protein: 7.2 g/dL (ref 6.0–8.5)
eGFR: 80 mL/min/{1.73_m2} (ref >59)

## 2022-09-09 LAB — TSH: TSH: 1.04 u[IU]/mL (ref 0.450–4.500)

## 2022-09-09 LAB — VITAMIN B12: Vitamin B-12: 571 pg/mL (ref 232–1245)

## 2022-09-09 LAB — RPR: RPR Ser Ql: NONREACTIVE

## 2022-09-11 ENCOUNTER — Telehealth: Payer: Self-pay

## 2022-09-11 NOTE — Telephone Encounter (Signed)
Called and spoke to [facility] per DPR about lab results and she was understanding and appreciative of the call. Advised to call back with any concerns or questions.

## 2022-09-11 NOTE — Telephone Encounter (Signed)
-----  Message from Marcial Pacas, MD sent at 09/11/2022 11:05 AM EST ----- Please call patient, laboratory evaluation showed: --- Mild elevated calcium 10.6, and sodium 146, --- Rest of the laboratory evaluation showed no significant abnormalities  --- I have forwarded the lab result to her primary care Shara Blazing, FNP, she may repeat laboratory evaluation at next follow-up with her PCP to make sure normalization of electrolyte.

## 2022-09-20 ENCOUNTER — Ambulatory Visit (INDEPENDENT_AMBULATORY_CARE_PROVIDER_SITE_OTHER): Payer: 59 | Admitting: Podiatry

## 2022-09-20 ENCOUNTER — Encounter: Payer: Self-pay | Admitting: Podiatry

## 2022-09-20 DIAGNOSIS — M79674 Pain in right toe(s): Secondary | ICD-10-CM | POA: Diagnosis not present

## 2022-09-20 DIAGNOSIS — M79675 Pain in left toe(s): Secondary | ICD-10-CM | POA: Diagnosis not present

## 2022-09-20 DIAGNOSIS — B351 Tinea unguium: Secondary | ICD-10-CM | POA: Diagnosis not present

## 2022-09-20 NOTE — Progress Notes (Signed)
Subjective:   Patient ID: Emily Brown, female   DOB: 77 y.o.   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

## 2022-12-02 ENCOUNTER — Emergency Department (HOSPITAL_COMMUNITY): Payer: 59

## 2022-12-02 ENCOUNTER — Emergency Department (HOSPITAL_COMMUNITY)
Admission: EM | Admit: 2022-12-02 | Discharge: 2022-12-02 | Disposition: A | Discharge status: Home / Self Care | Payer: 59 | Attending: Emergency Medicine | Admitting: Emergency Medicine

## 2022-12-02 ENCOUNTER — Other Ambulatory Visit: Payer: Self-pay

## 2022-12-02 DIAGNOSIS — K5732 Diverticulitis of large intestine without perforation or abscess without bleeding: Secondary | ICD-10-CM | POA: Diagnosis not present

## 2022-12-02 DIAGNOSIS — K5900 Constipation, unspecified: Secondary | ICD-10-CM

## 2022-12-02 DIAGNOSIS — R109 Unspecified abdominal pain: Secondary | ICD-10-CM | POA: Diagnosis present

## 2022-12-02 DIAGNOSIS — K5792 Diverticulitis of intestine, part unspecified, without perforation or abscess without bleeding: Secondary | ICD-10-CM

## 2022-12-02 LAB — COMPREHENSIVE METABOLIC PANEL
ALT: 21 U/L (ref 0–44)
AST: 22 U/L (ref 15–41)
Albumin: 3.7 g/dL (ref 3.5–5.0)
Alkaline Phosphatase: 72 U/L (ref 38–126)
Anion gap: 14 (ref 5–15)
BUN: 12 mg/dL (ref 8–23)
CO2: 25 mmol/L (ref 22–32)
Calcium: 9.5 mg/dL (ref 8.9–10.3)
Chloride: 101 mmol/L (ref 98–111)
Creatinine, Ser: 0.83 mg/dL (ref 0.44–1.00)
GFR, Estimated: >60 mL/min (ref >60)
Glucose, Bld: 112 mg/dL — ABNORMAL HIGH (ref 70–99)
Potassium: 3 mmol/L — ABNORMAL LOW (ref 3.5–5.1)
Sodium: 140 mmol/L (ref 135–145)
Total Bilirubin: 0.6 mg/dL (ref 0.3–1.2)
Total Protein: 6.9 g/dL (ref 6.5–8.1)

## 2022-12-02 LAB — CBC
HCT: 42.6 % (ref 36.0–46.0)
Hemoglobin: 13.6 g/dL (ref 12.0–15.0)
MCH: 26.1 pg (ref 26.0–34.0)
MCHC: 31.9 g/dL (ref 30.0–36.0)
MCV: 81.6 fL (ref 80.0–100.0)
Platelets: 343 10*3/uL (ref 150–400)
RBC: 5.22 MIL/uL — ABNORMAL HIGH (ref 3.87–5.11)
RDW: 13.2 % (ref 11.5–15.5)
WBC: 9.2 10*3/uL (ref 4.0–10.5)
nRBC: 0 % (ref 0.0–0.2)

## 2022-12-02 LAB — LIPASE, BLOOD: Lipase: 30 U/L (ref 11–51)

## 2022-12-02 MED ORDER — CIPROFLOXACIN HCL 500 MG PO TABS: 500.0000 mg | ORAL_TABLET | Freq: Two times a day (BID) | ORAL | 0 refills | Status: DC

## 2022-12-02 MED ORDER — CIPROFLOXACIN HCL 500 MG PO TABS
500.0000 mg | ORAL_TABLET | Freq: Once | ORAL | Status: AC
  Administered 2022-12-02: 500 mg via ORAL
  Filled 2022-12-02: qty 1

## 2022-12-02 MED ORDER — METRONIDAZOLE 500 MG PO TABS
500.0000 mg | ORAL_TABLET | Freq: Once | ORAL | Status: AC
  Administered 2022-12-02: 500 mg via ORAL
  Filled 2022-12-02: qty 1

## 2022-12-02 MED ORDER — OXYCODONE-ACETAMINOPHEN 5-325 MG PO TABS: 1.0000 | ORAL_TABLET | Freq: Four times a day (QID) | ORAL | 0 refills | Status: DC | PRN

## 2022-12-02 MED ORDER — METRONIDAZOLE 500 MG PO TABS: 500.0000 mg | ORAL_TABLET | Freq: Two times a day (BID) | ORAL | 0 refills | Status: DC

## 2022-12-02 MED ORDER — IOHEXOL 350 MG/ML SOLN
75.0000 mL | Freq: Once | INTRAVENOUS | Status: AC | PRN
  Administered 2022-12-02: 75 mL via INTRAVENOUS

## 2022-12-02 NOTE — ED Notes (Signed)
Assumed care of pt with hx of IBS and chronic constipation here c/o lower abdominal pain and constipation.  Pt reports small hard bm today . Bowel sounds present in all quads abd soft but tender in rlq and llq per pt. Pt denies n/v/d is a/o x 4 respirations even and non labored. Pt pending CT and dx results

## 2022-12-02 NOTE — ED Triage Notes (Signed)
Pt presents with continued abdominal pain and cramping with constipation.  Seen at her physician on Tuesday and has been using miralax with no relief.  Today tried castor oil and orange juice and had  a bowel movement but continues to have abdominal and low back pain and was told if this occurred to follow up at the ED.  Pt endorses hx of IBS

## 2022-12-02 NOTE — ED Provider Notes (Signed)
[place] [facility]ERGENCY DEPARTMENT AT [facility] Provider Note   CSN: [id number] Arrival date & time: 12/02/22  1652    History  Chief Complaint  Patient presents with   Abdominal Pain    Emily Brown is a 77 y.o. female with hx significant for chronic constipation on Linzess and miralax and metamucil here for evaluation of abd pain. Began earlier this week. Uped Miralax to BID, small BM yesterday however no relief. Hx of diverticulitis. No blood in stool. No N/V or fever. No dysuria or hematuria.  HPI     Home Medications Prior to Admission medications   Medication Sig Start Date End Date Taking? Authorizing Provider  ciprofloxacin (CIPRO) 500 MG tablet Take 1 tablet (500 mg total) by mouth every 12 (twelve) hours. 12/02/22  Yes Alecxander Mainwaring A, PA-C  metroNIDAZOLE (FLAGYL) 500 MG tablet Take 1 tablet (500 mg total) by mouth 2 (two) times daily. 12/02/22  Yes Makaylee Spielberg A, PA-C  oxyCODONE-acetaminophen (PERCOCET/ROXICET) 5-325 MG tablet Take 1 tablet by mouth every 6 (six) hours as needed for severe pain. 12/02/22  Yes Rajanee Schuelke A, PA-C  albuterol (PROVENTIL HFA;VENTOLIN HFA) 108 (90 Base) MCG/ACT inhaler Inhale 1-2 puffs into the lungs every 6 (six) hours as needed for wheezing or shortness of breath. 06/04/16   Charlynne Pander, MD  fluticasone [facility]) 50 MCG/ACT nasal spray Place 1 spray into both nostrils daily as needed for allergies or rhinitis.    [provider]  hydrochlorothiazide (HYDRODIURIL) 25 MG tablet Take 25 mg by mouth daily. 04/25/16   [provider]  Latanoprostene Bunod (VYZULTA) 0.024 % SOLN Apply 1 drop to eye daily.    [provider]  Multiple Vitamins-Minerals (PRESERVISION AREDS PO) Take 1 capsule by mouth daily.     [provider]  Polyethyl Glycol-Propyl Glycol (SYSTANE) 0.4-0.3 % GEL ophthalmic gel Place 1 application into both eyes 2 (two) times daily.    [provider]   polyethylene glycol (MIRALAX) packet Take 17 g by mouth daily. 12/06/14   Elson Areas, PA-C  rosuvastatin (CRESTOR) 5 MG tablet Take 5 mg by mouth every other day. 10/14/21   [provider]      Allergies    Dicyclomine, Ace inhibitors, Amoxicillin, Ceftin [cefuroxime axetil], and Penicillins    Review of Systems   Review of Systems  Constitutional: Negative.   HENT: Negative.    Respiratory: Negative.    Cardiovascular: Negative.   Gastrointestinal:  Positive for abdominal pain and constipation. Negative for anal bleeding, blood in stool, diarrhea, nausea, rectal pain and vomiting.  Genitourinary: Negative.   Musculoskeletal: Negative.   Skin: Negative.   Neurological: Negative.   All other systems reviewed and are negative.   Physical Exam Updated Vital Signs BP 116/66 (BP Location: Right Arm)   Pulse 78   Temp 98.3 F (36.8 C) (Oral)   Resp 18   SpO2 100%  Physical Exam Vitals and nursing note reviewed.  Constitutional:      General: She is not in acute distress.    Appearance: She is well-developed. She is not ill-appearing, toxic-appearing or diaphoretic.  HENT:     Head: Normocephalic and atraumatic.  Eyes:     Pupils: Pupils are equal, round, and reactive to light.  Cardiovascular:     Rate and Rhythm: Normal rate.     Heart sounds: Normal heart sounds.  Pulmonary:     Effort: Pulmonary effort is normal. No respiratory distress.  Breath sounds: Normal breath sounds.  Abdominal:     General: Bowel sounds are normal. There is no distension.     Palpations: Abdomen is soft.     Tenderness: There is generalized abdominal tenderness. There is no right CVA tenderness, left CVA tenderness, guarding or rebound.     Hernia: No hernia is present.  Musculoskeletal:        General: Normal range of motion.     Cervical back: Normal range of motion.  Skin:    General: Skin is warm and dry.  Neurological:     General: No focal deficit present.      Mental Status: She is alert.  Psychiatric:        Mood and Affect: Mood normal.    ED Results / Procedures / Treatments   Labs (all labs ordered are listed, but only abnormal results are displayed) Labs Reviewed  COMPREHENSIVE METABOLIC PANEL - Abnormal; Notable for the following components:      Result Value   Potassium 3.0 (*)    Glucose, Bld 112 (*)    All other components within normal limits  CBC - Abnormal; Notable for the following components:   RBC 5.22 (*)    All other components within normal limits  LIPASE, BLOOD    EKG None  Radiology No results found. CLINICAL DATA: Acute nonlocalized abdominal pain  EXAM: CT ABDOMEN AND PELVIS WITH CONTRAST  TECHNIQUE: Multidetector CT imaging of the abdomen and pelvis was performed using the standard protocol following bolus administration of intravenous contrast.  RADIATION DOSE REDUCTION: This exam was performed according to the departmental dose-optimization program which includes automated exposure control, adjustment of the mA and/or kV according to patient size and/or use of iterative reconstruction technique.  CONTRAST: 75mL OMNIPAQUE IOHEXOL 350 MG/ML SOLN  COMPARISON: 03/31/2017  FINDINGS: Lower chest: Bibasilar atelectasis/scarring. No acute abnormality.  Hepatobiliary: No focal liver abnormality is seen. No gallstones, gallbladder wall thickening, or biliary dilatation.  Pancreas: Unremarkable. No pancreatic ductal dilatation or surrounding inflammatory changes.  Spleen: Unremarkable.  Adrenals/Urinary Tract: Normal adrenal glands. No urinary calculi or hydronephrosis. Simple left renal cyst. No follow-up recommended. Unremarkable bladder.  Stomach/Bowel: Extensive diverticulosis about the distal descending colon and sigmoid colon. There is mild wall stranding and adjacent. Colonic fat stranding about the descending and sigmoid colon compatible with diverticulitis. No organized fluid collection  or abscess. No free intraperitoneal air. Normal appendix. Stomach is within normal limits. No evidence of obstruction.  Vascular/Lymphatic: No significant vascular findings are present. No enlarged abdominal or pelvic lymph nodes.  Reproductive: Hysterectomy.  Other: No free intraperitoneal air. No abdominal wall hernia.  Musculoskeletal: No acute osseous abnormality.  IMPRESSION: Acute uncomplicated sigmoid diverticulitis. No evidence of perforation or abscess. Consider follow-up colonoscopy following resolution of patient's acute symptoms to exclude underlying mass.   Electronically Signed By: Minerva Fester M.D. On: 12/02/2022 20:16  Procedures Procedures    Medications Ordered in ED Medications  iohexol (OMNIPAQUE) 350 MG/ML injection 75 mL (75 mLs Intravenous Contrast Given 12/02/22 2008)  ciprofloxacin (CIPRO) tablet 500 mg (500 mg Oral Given 12/02/22 2236)  metroNIDAZOLE (FLAGYL) tablet 500 mg (500 mg Oral Given 12/02/22 2236)    ED Course/ Medical Decision Making/ A&P   77 year old here for evaluation of abd pain and constipation. Hx of similar unrelieved with home meds. Hx of diverticulitis. No fever. Declined pain meds. Appear clinically well hydrated.  Labs and imaging personally viewed and interpreted:  CBC without leukocytosis Metabolic panel potassium 3.09  lipase 30 CT abdomen pelvis with uncomplicated diverticulitis  I discussed results with patient, family in room.  She has not needed anything for pain while here in the emergency department.  White count is not elevated she has no tachycardia no fever.  I discussed outpatient treatment which she is agreeable for.  She was given first dose of antibiotics here in the emergency department.  We also discussed bowel regimen given she has history of chronic constipation she currently follows with [facility].  Encouraged to follow-up with them on an outpatient basis.  She will return for new or worsening  symptoms.  The patient has been appropriately medically screened and/or stabilized in the ED. I have low suspicion for any other emergent medical condition which would require further screening, evaluation or treatment in the ED or require inpatient management.  Patient is hemodynamically stable and in no acute distress.  Patient able to ambulate in department prior to ED.  Evaluation does not show acute pathology that would require ongoing or additional emergent interventions while in the emergency department or further inpatient treatment.  I have discussed the diagnosis with the patient and answered all questions.  Pain is been managed while in the emergency department and patient has no further complaints prior to discharge.  Patient is comfortable with plan discussed in room and is stable for discharge at this time.  I have discussed strict return precautions for returning to the emergency department.  Patient was encouraged to follow-up with PCP/specialist refer to at discharge.                              Medical Decision Making Amount and/or Complexity of Data Reviewed Independent Historian:     Details: Family in room External Data Reviewed: labs, radiology and notes. Labs: ordered. Decision-making details documented in ED Course. Radiology: ordered and independent interpretation performed. Decision-making details documented in ED Course.  Risk OTC drugs. Prescription drug management. Decision regarding hospitalization. Diagnosis or treatment significantly limited by social determinants of health.           Final Clinical Impression(s) / ED Diagnoses Final diagnoses:  Diverticulitis  Constipation, unspecified constipation type    Rx / DC Orders ED Discharge Orders          Ordered    ciprofloxacin (CIPRO) 500 MG tablet  Every 12 hours        12/02/22 2209    metroNIDAZOLE (FLAGYL) 500 MG tablet  2 times daily        12/02/22 2209    oxyCODONE-acetaminophen  (PERCOCET/ROXICET) 5-325 MG tablet  Every 6 hours PRN        12/02/22 2209              Porshea Janowski A, PA-C 12/02/22 2317    Glyn Ade, MD 12/02/22 [phone]

## 2022-12-02 NOTE — ED Notes (Signed)
Pt going to CT

## 2022-12-02 NOTE — Discharge Instructions (Addendum)
It was our pleasure taking care of you here in the emergency department today  As discussed your CT scan did show diverticulitis. We have started you on antibiotics. Take as prescribed. You are given your first dose here in the emergency department you may pick up the remaining antibiotics at the pharmacy tomorrow

## 2022-12-02 NOTE — ED Notes (Signed)
Pt returned from CT °

## 2023-01-01 ENCOUNTER — Telehealth: Payer: Self-pay | Admitting: Neurology

## 2023-01-01 NOTE — Telephone Encounter (Signed)
Called sister back and she states she was recently changed from HCTZ to amlodipine and in the last week she has noticed a change in her short term memory and wants to know if this can be contributing and if so what should next steps be.

## 2023-01-01 NOTE — Telephone Encounter (Signed)
Pt sister stated she would like to speak to nurse about medication HCTZ. Stated pt PCP is trying to change pt medication but she wants to talk to Dr Terrace Arabia first.

## 2023-01-02 NOTE — Telephone Encounter (Signed)
Please advise her to discuss with primary care about blood pressure medication management  Less likely changing from hydrochlorothiazide to amlodipine would cause significant change in her memory

## 2023-01-03 NOTE — Telephone Encounter (Signed)
Called and spoke to pts sister and relayed information they voiced understanding to send to contact pcp

## 2023-01-24 ENCOUNTER — Ambulatory Visit (INDEPENDENT_AMBULATORY_CARE_PROVIDER_SITE_OTHER): Payer: 59 | Admitting: Podiatry

## 2023-01-24 ENCOUNTER — Encounter: Payer: Self-pay | Admitting: Podiatry

## 2023-01-24 DIAGNOSIS — M79675 Pain in left toe(s): Secondary | ICD-10-CM

## 2023-01-24 DIAGNOSIS — F0390 Unspecified dementia without behavioral disturbance: Secondary | ICD-10-CM | POA: Diagnosis not present

## 2023-01-24 DIAGNOSIS — B351 Tinea unguium: Secondary | ICD-10-CM | POA: Diagnosis not present

## 2023-01-24 DIAGNOSIS — M79674 Pain in right toe(s): Secondary | ICD-10-CM | POA: Diagnosis not present

## 2023-01-24 NOTE — Progress Notes (Signed)
This patient presents to the office with chief complaint of long thick painful nails.  Patient says the nails are painful walking and wearing shoes.  This patient is unable to self treat.  This patient is unable to trim her nails since she is unable to reach her nails.  She presents to the office for preventative foot care services.  General Appearance  Alert, conversant and in no acute stress.  Vascular  Dorsalis pedis and posterior tibial  pulses are palpable  bilaterally.  Capillary return is within normal limits  bilaterally. Temperature is within normal limits  bilaterally.  Neurologic  Senn-Weinstein monofilament wire test within normal limits  bilaterally. Muscle power within normal limits bilaterally.  Nails Thick disfigured discolored nails with subungual debris  from hallux to fifth toes bilaterally. No evidence of bacterial infection or drainage bilaterally.  Left hallux nail is loosely attached to nail bed.  Orthopedic  No limitations of motion  feet .  No crepitus or effusions noted.  No bony pathology or digital deformities noted. HAV  B/L.  Skin  normotropic skin with no porokeratosis noted bilaterally.  No signs of infections or ulcers noted.     Onychomycosis  Nails  B/L.  Pain in right toes  Pain in left toes  Debridement of nails both feet followed trimming the nails with dremel tool.    RTC 4  months.   Helane Gunther DPM

## 2023-02-06 ENCOUNTER — Other Ambulatory Visit: Payer: Self-pay | Admitting: Physician Assistant

## 2023-02-06 DIAGNOSIS — E2839 Other primary ovarian failure: Secondary | ICD-10-CM

## 2023-02-06 DIAGNOSIS — Z0000 Encounter for general adult medical examination without abnormal findings: Secondary | ICD-10-CM

## 2023-03-02 ENCOUNTER — Ambulatory Visit: Admission: RE | Admit: 2023-03-02 | Payer: 59 | Source: Ambulatory Visit

## 2023-03-02 DIAGNOSIS — Z0000 Encounter for general adult medical examination without abnormal findings: Secondary | ICD-10-CM

## 2023-05-19 ENCOUNTER — Encounter (HOSPITAL_COMMUNITY): Payer: Self-pay

## 2023-05-19 ENCOUNTER — Other Ambulatory Visit: Payer: Self-pay

## 2023-05-19 ENCOUNTER — Emergency Department (HOSPITAL_COMMUNITY)
Admission: EM | Admit: 2023-05-19 | Discharge: 2023-05-19 | Disposition: A | Discharge status: Home / Self Care | Payer: 59 | Attending: Emergency Medicine | Admitting: Emergency Medicine

## 2023-05-19 DIAGNOSIS — I10 Essential (primary) hypertension: Secondary | ICD-10-CM | POA: Diagnosis not present

## 2023-05-19 DIAGNOSIS — W108XXA Fall (on) (from) other stairs and steps, initial encounter: Secondary | ICD-10-CM | POA: Diagnosis not present

## 2023-05-19 DIAGNOSIS — S61411A Laceration without foreign body of right hand, initial encounter: Secondary | ICD-10-CM | POA: Diagnosis not present

## 2023-05-19 DIAGNOSIS — S6991XA Unspecified injury of right wrist, hand and finger(s), initial encounter: Secondary | ICD-10-CM | POA: Diagnosis present

## 2023-05-19 DIAGNOSIS — Z79899 Other long term (current) drug therapy: Secondary | ICD-10-CM | POA: Insufficient documentation

## 2023-05-19 NOTE — ED Triage Notes (Signed)
Pt came in via POV d/t falling off steps on a porch this morning & when landing, caught herself & cut her Rt hand on the bricks of the porch. A/Ox4, Denies pain during triage.

## 2023-05-19 NOTE — ED Provider Notes (Signed)
[place] EMERGENCY DEPARTMENT AT [facility] Provider Note   CSN: [id number] Arrival date & time: 05/19/23  8657     History  Chief Complaint  Patient presents with   Rt Hand Lac    Emily Brown is a 77 y.o. female.  HPI  Patient is a 77 year old female With history of hypertension, memory loss, liver disease, IBS  She presents emergency room today with right hand pain at the base of her thumb where she cut her thumb when she tripped on some bricks and fell forwards.  She states that the area is not painful with movement of her finger.  Her sister felt that the laceration that occurred to her thumb when she fell need to be evaluated and took her to urgent care however since the office was closed a came to the emergency room for evaluation.  She denies any wrist or hand pain other than the cut on her hand which hurts.  She is uncertain of her last tetanus but thinks that it was done within the last year.  She denies any lightheadedness or vertigo or feelings of being off balance.      Home Medications Prior to Admission medications   Medication Sig Start Date End Date Taking? Authorizing Provider  albuterol (PROVENTIL HFA;VENTOLIN HFA) 108 (90 Base) MCG/ACT inhaler Inhale 1-2 puffs into the lungs every 6 (six) hours as needed for wheezing or shortness of breath. 06/04/16   Charlynne Pander, MD  ciprofloxacin (CIPRO) 500 MG tablet Take 1 tablet (500 mg total) by mouth every 12 (twelve) hours. 12/02/22   Henderly, Britni A, PA-C  fluticasone (FLONASE) 50 MCG/ACT nasal spray Place 1 spray into both nostrils daily as needed for allergies or rhinitis.    [provider]  hydrochlorothiazide (HYDRODIURIL) 25 MG tablet Take 25 mg by mouth daily. 04/25/16   [provider]  Latanoprostene Bunod (VYZULTA) 0.024 % SOLN Apply 1 drop to eye daily.    [provider]  metroNIDAZOLE (FLAGYL) 500 MG tablet Take 1 tablet (500 mg total) by mouth 2 (two)  times daily. 12/02/22   Henderly, Britni A, PA-C  Multiple Vitamins-Minerals (PRESERVISION AREDS PO) Take 1 capsule by mouth daily.     [provider]  oxyCODONE-acetaminophen (PERCOCET/ROXICET) 5-325 MG tablet Take 1 tablet by mouth every 6 (six) hours as needed for severe pain. 12/02/22   Henderly, Britni A, PA-C  Polyethyl Glycol-Propyl Glycol (SYSTANE) 0.4-0.3 % GEL ophthalmic gel Place 1 application into both eyes 2 (two) times daily.    [provider]  polyethylene glycol (MIRALAX) packet Take 17 g by mouth daily. 12/06/14   Elson Areas, PA-C  rosuvastatin (CRESTOR) 5 MG tablet Take 5 mg by mouth every other day. 10/14/21   [provider]      Allergies    Dicyclomine, Ace inhibitors, Amoxicillin, Ceftin [cefuroxime axetil], and Penicillins    Review of Systems   Review of Systems  Physical Exam Updated Vital Signs BP 127/65   Pulse 77   Temp 98 F (36.7 C) (Oral)   Resp 18   Ht 5\' 7"  (1.702 m)   Wt 84.4 kg   SpO2 97%   BMI 29.13 kg/m  Physical Exam Vitals and nursing note reviewed.  Constitutional:      General: She is not in acute distress.    Appearance: Normal appearance. She is not ill-appearing.  HENT:     Head: Normocephalic and atraumatic.     Mouth/Throat:  Mouth: Mucous membranes are moist.  Eyes:     General: No scleral icterus.       Right eye: No discharge.        Left eye: No discharge.     Conjunctiva/sclera: Conjunctivae normal.  Pulmonary:     Effort: Pulmonary effort is normal.     Breath sounds: No stridor.  Abdominal:     Tenderness: There is no abdominal tenderness.  Musculoskeletal:     Comments: Full range of motion of bilateral wrists with no upper extremity tenderness\  Full range of motion of thumb with abduction adduction apposition flexion extension.  Sensation intact in thumb  Skin:    General: Skin is warm and dry.     Comments: There is a laceration at the base of the right thumb that has cut just  under the epidermis there is some dermis visible.  No active bleeding.  No gaping wound.  The skin flap that was created by the laceration is well-approximated along the wound edges.  Neurological:     Mental Status: She is alert and oriented to person, place, and time. Mental status is at baseline.     ED Results / Procedures / Treatments   Labs (all labs ordered are listed, but only abnormal results are displayed) Labs Reviewed - No data to display  EKG None  Radiology No results found.  Procedures Procedures    Medications Ordered in ED Medications - No data to display  ED Course/ Medical Decision Making/ A&P Clinical Course as of 05/19/23 1109  Sat May 19, 2023  0907 Last tdap 09/2022 [WF]    Clinical Course User Index [WF] Gailen Shelter, PA                                 Medical Decision Making  Patient is a 77 year old female With history of hypertension, memory loss, liver disease, IBS  She presents emergency room today with right hand pain at the base of her thumb where she cut her thumb when she tripped on some bricks and fell forwards.  She states that the area is not painful with movement of her finger.  Her sister felt that the laceration that occurred to her thumb when she fell need to be evaluated and took her to urgent care however since the office was closed a came to the emergency room for evaluation.  She denies any wrist or hand pain other than the cut on her hand which hurts.  She is uncertain of her last tetanus but thinks that it was done within the last year.  She denies any lightheadedness or vertigo or feelings of being off balance.  Distally neurovascularly intact.  Well-appearing.  This laceration does not require suturing.  I cleaned it thoroughly myself with tap water it was cleaned prior to arrival by patient's sister who used H2O2.  I gave wound care instructions including not to use H2O2  Bacitracin ointment was provided and packets.   Bandaged and will follow-up outpatient as needed wound care instructions were able to be taught back by patient and sister.  I have no suspicion for a fracture I will hold off on any x-rays at this time for this reason.  Will discharge home at this time  Final Clinical Impression(s) / ED Diagnoses Final diagnoses:  Laceration of right hand, foreign body presence unspecified, initial encounter    Rx / DC Orders ED  Discharge Orders     None         Solon Augusta [place], [place] 05/19/23 1343    Jacalyn Lefevre, MD 05/20/23 1550

## 2023-05-19 NOTE — Discharge Instructions (Signed)
Keep clean with warm soap and water. Dab dry.  Then apply bacitracin ointment.

## 2023-07-06 ENCOUNTER — Encounter (HOSPITAL_BASED_OUTPATIENT_CLINIC_OR_DEPARTMENT_OTHER): Payer: Self-pay | Admitting: Certified Nurse Midwife

## 2023-07-06 ENCOUNTER — Ambulatory Visit (HOSPITAL_BASED_OUTPATIENT_CLINIC_OR_DEPARTMENT_OTHER): Payer: 59 | Admitting: Certified Nurse Midwife

## 2023-07-06 VITALS — BP 136/72 | HR 73 | Ht 67.0 in | Wt 178.6 lb

## 2023-07-06 DIAGNOSIS — R1084 Generalized abdominal pain: Secondary | ICD-10-CM | POA: Diagnosis not present

## 2023-07-06 NOTE — Progress Notes (Signed)
GYNECOLOGY  VISIT  CC:   abdominal pain  HPI: 77 y.o. G0P0000 Single Black or [place] American female here for new problem gyn visit. She is accompanied by her younger sister. Emily Brown has no history of being sexually active. Her sister states that Emily Brown has been suffering with abdominal pain for many months and she decided to bring her to GYN office to rule out any issues with her ovaries. History Hysterectomy but both ovaries intact per pt/Sister. She is being followed by Gastroenterology and Primary Care Provider.   When asked where the abdominal pain is located, patient pointed to area to the left and right of umbilicals (RMQ and LMQ). She denies any lower pelvic pain.   Past Medical History:  Diagnosis Date   Hypertension    IBS (irritable bowel syndrome)    Liver disease    Memory loss    NPH (normal pressure hydrocephalus) (HCC)    Thyroid cyst     MEDS:   Current Outpatient Medications on File Prior to Visit  Medication Sig Dispense Refill   albuterol (PROVENTIL HFA;VENTOLIN HFA) 108 (90 Base) MCG/ACT inhaler Inhale 1-2 puffs into the lungs every 6 (six) hours as needed for wheezing or shortness of breath. 1 Inhaler 0   ciprofloxacin (CIPRO) 500 MG tablet Take 1 tablet (500 mg total) by mouth every 12 (twelve) hours. 10 tablet 0   fluticasone (FLONASE) 50 MCG/ACT nasal spray Place 1 spray into both nostrils daily as needed for allergies or rhinitis.     hydrochlorothiazide (HYDRODIURIL) 25 MG tablet Take 25 mg by mouth daily.     Latanoprostene Bunod (VYZULTA) 0.024 % SOLN Apply 1 drop to eye daily.     metroNIDAZOLE (FLAGYL) 500 MG tablet Take 1 tablet (500 mg total) by mouth 2 (two) times daily. 14 tablet 0   Multiple Vitamins-Minerals (PRESERVISION AREDS PO) Take 1 capsule by mouth daily.      oxyCODONE-acetaminophen (PERCOCET/ROXICET) 5-325 MG tablet Take 1 tablet by mouth every 6 (six) hours as needed for severe pain. 15 tablet 0   Polyethyl Glycol-Propyl Glycol (SYSTANE)  0.4-0.3 % GEL ophthalmic gel Place 1 application into both eyes 2 (two) times daily.     polyethylene glycol (MIRALAX) packet Take 17 g by mouth daily. 14 each 0   rosuvastatin (CRESTOR) 5 MG tablet Take 5 mg by mouth every other day.     No current facility-administered medications on file prior to visit.    ALLERGIES: Dicyclomine, Ace inhibitors, Amoxicillin, Ceftin [cefuroxime axetil], and Penicillins  SH:  Accompanied by her younger sister. [place] raised 2 nephews.   ROS  PHYSICAL EXAMINATION:    BP 136/72 (BP Location: Left Arm, Patient Position: Sitting, Cuff Size: Normal)   Pulse 73   Ht 5\' 7"  (1.702 m)   Wt 178 lb 9.6 oz (81 kg)   BMI 27.97 kg/m     General appearance: alert, cooperative and appears stated age  Assessment/Plan: 1. Generalized abdominal pain - Discussed with patient and her sister that gyn [place] may be possible to evaluate bilateral ovaries and rule out ovarian pathology. 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 Pt aware that if she attempts [place] and it is uncomfortable or painful in any way, she will immediately let us know that she would like to stop the ultrasound and not proceed. Pt/Sister aware that transabdominal GYN may or may not be useful in assessing patient's ovaries. Follow-up US scheduled. Letta Kocher

## 2023-07-25 ENCOUNTER — Other Ambulatory Visit (HOSPITAL_BASED_OUTPATIENT_CLINIC_OR_DEPARTMENT_OTHER): Payer: Self-pay | Admitting: Certified Nurse Midwife

## 2023-07-25 ENCOUNTER — Ambulatory Visit (HOSPITAL_BASED_OUTPATIENT_CLINIC_OR_DEPARTMENT_OTHER): Payer: 59

## 2023-07-25 ENCOUNTER — Ambulatory Visit (HOSPITAL_BASED_OUTPATIENT_CLINIC_OR_DEPARTMENT_OTHER): Payer: 59 | Admitting: Certified Nurse Midwife

## 2023-07-25 VITALS — BP 138/71 | HR 66 | Ht 67.0 in | Wt 180.2 lb

## 2023-07-25 DIAGNOSIS — R1084 Generalized abdominal pain: Secondary | ICD-10-CM | POA: Diagnosis not present

## 2023-07-25 DIAGNOSIS — R109 Unspecified abdominal pain: Secondary | ICD-10-CM

## 2023-07-25 NOTE — Progress Notes (Addendum)
   Emily Brown is a 77yo G0 with Hx Hysterectomy here for [place]. Pt was evaluated on 07/06/23. Her sister was concerned that Emily Brown has been experiencing abdominal pain for many months. She wanted to rule out any issues with Emily Brown's ovaries. Pt returns today for Pelvic US. She has never been sexually active and may not be able to tolerate a transvaginal US.   [place] (07/25/23): Transabdominal scan performed. Bilateral ovaries are not visualized probably due to age. Bilateral adnexa with no masses noted. Targeted scan inferior to umbilicus reveals no large masses. Pt states this is where it hurts, but no pain noted upon pressure with scanning.  Assessment: Abdominal Pain Pelvic Pain  Plan: Discussed results of transabdominal US today suggest normal ovaries with no ovarian enlargement or abnormalities visible. She will follow-up as scheduled with her MD.  Emily Brown  CLINICAL DATA:  abdominal pain, never SA   EXAM: TRANSABDOMINAL ULTRASOUND OF PELVIS   TECHNIQUE: Transvaginal ultrasound examination of the pelvis was performed for imaging of the pelvis including uterus, ovaries, adnexal regions, and pelvic cul-de-sac.   Transabdominal ultrasound examination of the Pelvis was performed. Transabdominal technique was performed for global imaging of the pelvis including uterus, ovaries, adnexal regions, and pelvic cul-de-sac. Global scan of pelvic region was performed today.    FINDINGS: Uterus: surgically absent   Endometrial thickness:  n/a   Right ovary:  not visualized but no masses present   Left ovary:  not visualized but no masses present   Other findings:  No abnormal free fluid.   Assessment:  normal exam but limited due to transabdominal approach           Exam Ended: 07/25/23 10:21

## 2023-08-20 ENCOUNTER — Ambulatory Visit
Admission: RE | Admit: 2023-08-20 | Discharge: 2023-08-20 | Disposition: A | Discharge status: Home / Self Care | Payer: 59 | Source: Ambulatory Visit | Attending: Physician Assistant | Admitting: Physician Assistant

## 2023-08-20 DIAGNOSIS — E2839 Other primary ovarian failure: Secondary | ICD-10-CM

## 2023-09-05 ENCOUNTER — Ambulatory Visit (INDEPENDENT_AMBULATORY_CARE_PROVIDER_SITE_OTHER): Payer: 59 | Admitting: Podiatry

## 2023-09-05 ENCOUNTER — Encounter: Payer: Self-pay | Admitting: Podiatry

## 2023-09-05 DIAGNOSIS — F0390 Unspecified dementia without behavioral disturbance: Secondary | ICD-10-CM

## 2023-09-05 DIAGNOSIS — B351 Tinea unguium: Secondary | ICD-10-CM | POA: Diagnosis not present

## 2023-09-05 DIAGNOSIS — M79675 Pain in left toe(s): Secondary | ICD-10-CM | POA: Diagnosis not present

## 2023-09-05 DIAGNOSIS — M79674 Pain in right toe(s): Secondary | ICD-10-CM

## 2023-09-05 NOTE — Progress Notes (Signed)
This patient presents to the office with chief complaint of long thick painful nails.  Patient says the nails are painful walking and wearing shoes.  This patient is unable to self treat.  This patient is unable to trim her nails since she is unable to reach her nails.  She presents to the office for preventative foot care services.  General Appearance  Alert, conversant and in no acute stress.  Vascular  Dorsalis pedis and posterior tibial  pulses are palpable  bilaterally.  Capillary return is within normal limits  bilaterally. Temperature is within normal limits  bilaterally.  Neurologic  Senn-Weinstein monofilament wire test within normal limits  bilaterally. Muscle power within normal limits bilaterally.  Nails Thick disfigured discolored nails with subungual debris  from hallux to fifth toes bilaterally. No evidence of bacterial infection or drainage bilaterally.  Left hallux nail is loosely attached to nail bed.  Orthopedic  No limitations of motion  feet .  No crepitus or effusions noted.  No bony pathology or digital deformities noted. HAV  B/L.  Skin  normotropic skin with no porokeratosis noted bilaterally.  No signs of infections or ulcers noted.     Onychomycosis  Nails  B/L.  Pain in right toes  Pain in left toes  Debridement of nails both feet followed trimming the nails with dremel tool.    RTC 4  months.   Helane Gunther DPM

## 2023-11-20 ENCOUNTER — Ambulatory Visit: Attending: Adult Health Nurse Practitioner | Admitting: Audiologist

## 2023-11-20 DIAGNOSIS — H903 Sensorineural hearing loss, bilateral: Secondary | ICD-10-CM | POA: Diagnosis present

## 2023-11-20 DIAGNOSIS — H6121 Impacted cerumen, right ear: Secondary | ICD-10-CM | POA: Diagnosis present

## 2023-11-20 NOTE — Procedures (Signed)
 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 Emily Brown was seen due to a perceived decrease in hearing. Emily Brown reported aural fullness in her right ear only.  Emily Brown stated that she has a history of  impacted cerumen, noting that she has had her PCP attempt removal twice without success. Emily Brown denied any ear pain or hazardous noise exposure. Emily Brown endorsed intermittent tinnitus but she does not find it bothersome and does not affect her sleep. Emily Brown does have a diagnosis of dementia.  Emily Brown reported that Emily Brown has had her hearing tested in the past but could not say when. Results from this test were consistent with a high frequency hearing loss, but hearing aids were not recommended at that time per Brown's report.    Evaluation:  Otoscopy showed a clear view of the tympanic membrane in the left ear, tympanic membrane in the right ear could not be visualized due to excessive cerumen Tympanometry results were consistent with normal movement of the tympanic membranes (Type A) bilaterally  Audiometric testing was completed using Conventional Audiometry techniques with high frequency headphones. Test results are consistent with predominantly mild sensorineural hearing loss from 250 Hz - 2000 Hz sloping to moderately severe sensorineural hearing loss from 3000 Hz -8000 Hz in the left ear and a predominantly moderate sensorineural hearing loss sloping to a moderately severe sensorineural hearing loss from 3000 Hz to 8000 Hz in the right  ear with the exception of a conductive component at 1000 Hz however this could be due to the cerumen in that ear. Speech Recognition Thresholds were obtained at 45 dB HL in the right ear and at 30 dB HL in the left ear. Word Recognition Testing was completed at 85 dB HL in the right ear and 70 dB HL in the left ear and Emily Brown scored 64% and 88% respectively.    Results:  The test results were reviewed with Emily Brown and her Brown. Emily Brown has a predominantly mild sensorineural hearing loss from 250 Hz - 2000 Hz sloping to moderately severe sensorineural hearing loss from 3000 Hz -8000 Hz in the left ear and a predominantly moderate sensorineural hearing loss sloping to a moderate severe sensorineural hearing loss from 3000 Hz- 8000 Hz in the right ear. Tympanometry revealed normal movement of the tympanic membranes bilaterally suggesting that the cerumen in the right ear is not occluding. Emily Brown could benefit from consistent hearing aid usage in both ears. Emily Brown and her Brown were provided with [facility]'s number to call for in network hearing aid providers, they were also given a list of local ENT/ hearing aid providers.  Recommendations: 1.   Referral to ENT for cerumen removal   2.  Consistent hearing aid use in both ears due to mild to moderately severe hearing loss  3. Continued annual monitoring of hearing    40 minutes spent testing and counseling on results.   If you have any questions please feel free to contact me at [phone].  Ammie Ferrier Audiologist, Au.D., CCC-A 11/20/2023  1:38 PM  Emily Brown Si B.S.  Audiology Student   During this evaluation, the Audiologist was present, participating in and directing the student.  I agree with the following procedure note after reviewing documentation. This session was performed under the supervision of a licensed clinician.  During this session, the Audiologist  was present, participating in and directing the treatment.   Cc:  Jordan Hawks, PA-C

## 2023-12-06 ENCOUNTER — Telehealth: Payer: Self-pay | Admitting: Neurology

## 2023-12-06 NOTE — Telephone Encounter (Signed)
 Call to sister, reports that her sister has been having hallucinations increased the last few nights. She has also been wandering. Discussed in great detail disease process. Reviewed safety measures. Sister is in agreement to have UTI ruled out and then follow back up. Appt scheduled for 5/1

## 2023-12-06 NOTE — Telephone Encounter (Signed)
 Pt's sister called stating that the pt is getting worse and is now hallucinating. Sister would like to discuss. Pt is scheduled to see MD next month.

## 2023-12-20 ENCOUNTER — Ambulatory Visit: Admitting: Neurology

## 2023-12-20 ENCOUNTER — Encounter: Payer: Self-pay | Admitting: Neurology

## 2023-12-20 VITALS — BP 139/84 | HR 78 | Ht 66.5 in | Wt 177.5 lb

## 2023-12-20 DIAGNOSIS — G910 Communicating hydrocephalus: Secondary | ICD-10-CM | POA: Diagnosis not present

## 2023-12-20 DIAGNOSIS — R443 Hallucinations, unspecified: Secondary | ICD-10-CM

## 2023-12-20 DIAGNOSIS — F0393 Unspecified dementia, unspecified severity, with mood disturbance: Secondary | ICD-10-CM

## 2023-12-20 NOTE — Progress Notes (Signed)
 Chief Complaint  Patient presents with   Follow-up    Rm12, sister and friend present, sister would like to talk to dr. [name] in private, Communicating hydrocephalus, Dementia with mood disturbance, unspecified dementia severity, unspecified dementia type:PT sister stated that the pt has hallucinations, memory is worsening, mmse score of 15        ASSESSMENT AND PLAN  Emily Brown is a 78 y.o. female   Noncommunicating hydrocephalus Slow worsening dementia  Mini-Mental Status Examination 15/30,    Laboratory evaluation previously showed no treatable etiology   Again personally reviewed CT head in November 2016, chronic marked dilatation of the lateral ventricle, particularly atrium and occipital horn,  Slow decline is expected with her underlying hydrocephalus, aging, early onset central nervous system degenerative process  DIAGNOSTIC DATA (LABS, IMAGING, TESTING) - I reviewed patient records, labs, notes, testing and imaging myself where available. I reviewed the laboratory evaluation from [facility] in April 2022, normal CMP, creatinine 0.86, CBC, hemoglobin of 13, lipid panel, cholesterol was 227, LDL 153, vitamin D 29, B12 493, normal folic acid 20, parathyroid hormone 51, TSH 2.0  MEDICAL HISTORY:  Emily Brown is a 78 year old female, seen in request by her primary care PA Tova Fresh, completed by her sister Corbin Dess, evaluation of worsening dementia on March 10, 2021  I saw her previously in 2018, that was accompanied by her Carmela Chin, she is one of the 12 siblings,  She has always been a little slow since childhood, was helped back feel grades in school, eventually completed seventh grade at age 33,  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 All abnormality looked chronic, no new lesions. Laboratory evaluation which then showed no significant abnormality, including normal TSH, B12,  Over the years, she had slow decline, Mini-Mental Status Examination was 27/30 at last visit in 2018, today is 19 out of 30, she still lives alone, spends most of the time watching TV, no longer cooking, her Sister Corbin Dess would bring her to her place every night, she is scared to stay alone,  She has no trouble walking, still able to bathe, dress, tolerating without difficulty  Her family hope to get more including home care with her decline functional status, increased confusion,  Update September 05, 2022: She is with her Scherrie Curt at today's clinical visit, reports worsening over the past few months, occasional visual hallucinations, do not feel comfortable to be left alone at evening time.   Still independent during the day, walk with her sister tomorrow without much difficulty, does make heavy breathing sound, went through cardiac and pulmonary workup without significant abnormality found,  UPDATE Dec 20 2023: She is accompanied by her sister at today's clinical visit, lives alone, but sister will pick patient  up to her house every evening, she has worsening memory loss, recently complains of auditory hallucinations, but does not seems to bother her much, deny agitations    PHYSICAL EXAM:   Vitals:  12/20/23 1453 12/20/23 1502  BP: (!) 143/84 139/84  Pulse: 78   Weight: 177 lb 8 oz (80.5 kg)   Height: 5' 6.5" (1.689 m)        Body mass index is 28.22 kg/m.  PHYSICAL EXAMNIATION:  Gen: NAD,  conversant, well nourised, well groomed                     Cardiovascular: Regular rate rhythm, no peripheral edema, warm, nontender. Eyes: Conjunctivae clear without exudates or hemorrhage Neck: Supple, no carotid bruits. Pulmonary: Clear to auscultation bilaterally   NEUROLOGICAL EXAM:  MENTAL STATUS: Speech: Cooperative on examination, slow reaction time, macrocephaly Cognition:      12/20/2023    2:55 PM 09/05/2022    9:56 AM 03/10/2021    9:25 AM  MMSE - Mini Mental State Exam  Orientation to time 2 3 3   Orientation to Place 3 3 5   Registration 3 1 3   Attention/ Calculation 1 3 0  Recall 0 0 0  Language- name 2 objects 2 2 2   Language- repeat 1 1 1   Language- follow 3 step command 1 3 3   Language- read & follow direction 1 1 1   Write a sentence 1 0 0  Copy design 0 0 1  Total score 15 17 19       CRANIAL NERVES: CN II: Visual fields are full to confrontation. Pupils are round equal and briskly reactive to light. CN III, IV, VI: extraocular movement are normal. No ptosis. CN V: Facial sensation is intact to light touch CN VII: Face is symmetric with normal eye closure  CN VIII: Hearing is normal to causal conversation. CN IX, X: Phonation is normal. CN XI: Head turning and shoulder shrug are intact  MOTOR: Moves 4 extremities without difficulty  REFLEXES: Present symmetric  SENSORY: Intact to light touch   COORDINATION: There is no trunk or limb dysmetria noted.  GAIT/STANCE: Need to push up,  Gait is cautious  REVIEW OF SYSTEMS:  Full 14 system review of systems performed and notable only for as above All other review of systems were negative.   ALLERGIES: Allergies  Allergen Reactions   Amlodipine Other (See Comments)    Memory problem   Dicyclomine  Rash    Rash and itching   Ace Inhibitors Cough    sob   Amoxicillin Rash   Ceftin [Cefuroxime Axetil] Rash   Penicillins Rash   Rosuvastatin Other (See Comments)   Semaglutide Nausea Only     Nausea. Constipation    HOME MEDICATIONS: Current Outpatient Medications  Medication Sig Dispense Refill   hydrochlorothiazide (HYDRODIURIL) 25 MG tablet Take 25 mg by mouth daily.     Multiple Vitamins-Minerals (PRESERVISION AREDS PO) Take 1 capsule by mouth daily.      Polyethyl Glycol-Propyl Glycol (SYSTANE) 0.4-0.3 % GEL ophthalmic gel Place 1 application into both eyes 2 (two) times daily.     polyethylene glycol (MIRALAX ) packet Take 17 g by mouth daily. 14 each 0   rosuvastatin (CRESTOR) 5 MG tablet Take 5 mg by mouth every other day.     No current facility-administered medications for this visit.    PAST MEDICAL HISTORY: Past Medical History:  Diagnosis Date   Hypertension    IBS (irritable bowel syndrome)    Liver disease    Memory loss    NPH (normal pressure hydrocephalus) (HCC)    Thyroid cyst     PAST SURGICAL HISTORY: Past Surgical History:  Procedure Laterality Date  left breast tumor removal     PARTIAL HYSTERECTOMY      FAMILY HISTORY: Family History  Problem Relation Age of Onset   Diabetes Mother    Kidney disease Father    Breast cancer Sister     SOCIAL HISTORY: Social History   Socioeconomic History   Marital status: Single    Spouse name: Not on file   Number of children: 0   Years of education: 7 th   Highest education level: Not on file  Occupational History    Comment: Retired   Tobacco Use   Smoking status: Never   Smokeless tobacco: Never  Vaping Use   Vaping status: Never Used  Substance and Sexual Activity   Alcohol use: No    Alcohol/week: 0.0 standard drinks of alcohol   Drug use: No   Sexual activity: Not Currently    Birth control/protection: Surgical  Other Topics Concern   Not on file  Social History Narrative   Patient lives at home alone but stays with Sister Corbin Dess at night   Left handed.   Drinks 1 cup caffeine occassionally   Social Drivers of Corporate investment banker Strain: Low Risk  (09/06/2023)    Received from [facility]   Overall Financial Resource Strain (CARDIA)    Difficulty of Paying Living Expenses: Not hard at all  Food Insecurity: No Food Insecurity (09/06/2023)   Received from [facility]   Hunger Vital Sign    Worried About Running Out of Food in the Last Year: Never true    Ran Out of Food in the Last Year: Never true  Transportation Needs: No Transportation Needs (09/06/2023)   Received from [facility] - Transportation    Lack of Transportation (Medical): No    Lack of Transportation (Non-Medical): No  Physical Activity: Insufficiently Active (09/06/2023)   [facility]   Exercise Vital Sign    Days of Exercise per Week: 1 day    Minutes of Exercise per Session: 60 min  Stress: No Stress Concern Present (09/06/2023)   Received from [facility] - Occupational Stress Questionnaire    Feeling of Stress : Only a little  Social Connections: Socially Integrated (09/06/2023)   Received from [facility]   Social Network    How would you rate your social network (family, work, friends)?: Good participation with social networks  Intimate Partner Violence: Not At Risk (01/08/2023)   Received from [facility]   HITS    Over the last 12 months how often did your partner physically hurt you?: Never    Over the last 12 months how often did your partner insult you or talk down to you?: Never    Over the last 12 months how often did your partner threaten you with physical harm?: Never    Over the last 12 months how often did your partner scream or curse at you?: Never      Phebe Brasil, M.D. Ph.D.  [facility] [address], [address] [place], [place] [address] Ph: [phone] Fax: [phone]  CC:  Tova Fresh, PA-C [address] [place],  [place] [address]  Tova Fresh, PA-C

## 2023-12-20 NOTE — Patient Instructions (Signed)
 Emily Brown

## 2024-01-03 ENCOUNTER — Encounter: Payer: Self-pay | Admitting: Podiatry

## 2024-01-03 ENCOUNTER — Ambulatory Visit (INDEPENDENT_AMBULATORY_CARE_PROVIDER_SITE_OTHER): Payer: 59 | Admitting: Podiatry

## 2024-01-03 DIAGNOSIS — B351 Tinea unguium: Secondary | ICD-10-CM

## 2024-01-03 DIAGNOSIS — M79675 Pain in left toe(s): Secondary | ICD-10-CM

## 2024-01-03 DIAGNOSIS — F0390 Unspecified dementia without behavioral disturbance: Secondary | ICD-10-CM

## 2024-01-03 DIAGNOSIS — M79674 Pain in right toe(s): Secondary | ICD-10-CM

## 2024-01-03 NOTE — Progress Notes (Signed)
This patient presents to the office with chief complaint of long thick painful nails.  Patient says the nails are painful walking and wearing shoes.  This patient is unable to self treat.  This patient is unable to trim her nails since she is unable to reach her nails.  She presents to the office for preventative foot care services.  General Appearance  Alert, conversant and in no acute stress.  Vascular  Dorsalis pedis and posterior tibial  pulses are palpable  bilaterally.  Capillary return is within normal limits  bilaterally. Temperature is within normal limits  bilaterally.  Neurologic  Senn-Weinstein monofilament wire test within normal limits  bilaterally. Muscle power within normal limits bilaterally.  Nails Thick disfigured discolored nails with subungual debris  from hallux to fifth toes bilaterally. No evidence of bacterial infection or drainage bilaterally.  Left hallux nail is loosely attached to nail bed.  Orthopedic  No limitations of motion  feet .  No crepitus or effusions noted.  No bony pathology or digital deformities noted. HAV  B/L.  Skin  normotropic skin with no porokeratosis noted bilaterally.  No signs of infections or ulcers noted.     Onychomycosis  Nails  B/L.  Pain in right toes  Pain in left toes  Debridement of nails both feet followed trimming the nails with dremel tool.    RTC 4  months.   Helane Gunther DPM

## 2024-01-29 ENCOUNTER — Emergency Department (HOSPITAL_COMMUNITY)

## 2024-01-29 ENCOUNTER — Other Ambulatory Visit: Payer: Self-pay

## 2024-01-29 ENCOUNTER — Emergency Department (HOSPITAL_COMMUNITY)
Admission: EM | Admit: 2024-01-29 | Discharge: 2024-01-29 | Disposition: A | Discharge status: Home / Self Care | Attending: Emergency Medicine | Admitting: Emergency Medicine

## 2024-01-29 ENCOUNTER — Encounter (HOSPITAL_COMMUNITY): Payer: Self-pay

## 2024-01-29 DIAGNOSIS — R443 Hallucinations, unspecified: Secondary | ICD-10-CM | POA: Diagnosis present

## 2024-01-29 DIAGNOSIS — E876 Hypokalemia: Secondary | ICD-10-CM | POA: Insufficient documentation

## 2024-01-29 LAB — CBC WITH DIFFERENTIAL/PLATELET
Abs Immature Granulocytes: 0.01 10*3/uL (ref 0.00–0.07)
Basophils Absolute: 0 10*3/uL (ref 0.0–0.1)
Basophils Relative: 0 %
Eosinophils Absolute: 0.2 10*3/uL (ref 0.0–0.5)
Eosinophils Relative: 3 %
HCT: 40.4 % (ref 36.0–46.0)
Hemoglobin: 12.9 g/dL (ref 12.0–15.0)
Immature Granulocytes: 0 %
Lymphocytes Relative: 39 %
Lymphs Abs: 2.8 10*3/uL (ref 0.7–4.0)
MCH: 25.9 pg — ABNORMAL LOW (ref 26.0–34.0)
MCHC: 31.9 g/dL (ref 30.0–36.0)
MCV: 81.1 fL (ref 80.0–100.0)
Monocytes Absolute: 0.4 10*3/uL (ref 0.1–1.0)
Monocytes Relative: 6 %
Neutro Abs: 3.6 10*3/uL (ref 1.7–7.7)
Neutrophils Relative %: 52 %
Platelets: 326 10*3/uL (ref 150–400)
RBC: 4.98 MIL/uL (ref 3.87–5.11)
RDW: 13.2 % (ref 11.5–15.5)
WBC: 7.1 10*3/uL (ref 4.0–10.5)
nRBC: 0 % (ref 0.0–0.2)

## 2024-01-29 LAB — URINALYSIS, ROUTINE W REFLEX MICROSCOPIC
Bilirubin Urine: NEGATIVE
Glucose, UA: NEGATIVE mg/dL
Hgb urine dipstick: NEGATIVE
Ketones, ur: NEGATIVE mg/dL
Nitrite: NEGATIVE
Protein, ur: NEGATIVE mg/dL
Specific Gravity, Urine: 1.008 (ref 1.005–1.030)
pH: 5 (ref 5.0–8.0)

## 2024-01-29 LAB — COMPREHENSIVE METABOLIC PANEL WITH GFR
ALT: 20 U/L (ref 0–44)
AST: 20 U/L (ref 15–41)
Albumin: 3.9 g/dL (ref 3.5–5.0)
Alkaline Phosphatase: 64 U/L (ref 38–126)
Anion gap: 11 (ref 5–15)
BUN: 10 mg/dL (ref 8–23)
CO2: 26 mmol/L (ref 22–32)
Calcium: 9.6 mg/dL (ref 8.9–10.3)
Chloride: 102 mmol/L (ref 98–111)
Creatinine, Ser: 0.84 mg/dL (ref 0.44–1.00)
GFR, Estimated: >60 mL/min (ref >60)
Glucose, Bld: 87 mg/dL (ref 70–99)
Potassium: 3.1 mmol/L — ABNORMAL LOW (ref 3.5–5.1)
Sodium: 139 mmol/L (ref 135–145)
Total Bilirubin: 0.7 mg/dL (ref 0.0–1.2)
Total Protein: 7 g/dL (ref 6.5–8.1)

## 2024-01-29 MED ORDER — FOSFOMYCIN TROMETHAMINE 3 G PO PACK
3.0000 g | PACK | Freq: Once | ORAL | Status: AC
  Administered 2024-01-29: 3 g via ORAL
  Filled 2024-01-29: qty 3

## 2024-01-29 NOTE — Discharge Instructions (Addendum)
 Follow up with your family doc in the office.  Return If you feel you are unsafe at home or there is any acute change

## 2024-01-29 NOTE — ED Triage Notes (Addendum)
 Pt to ED c/o auditory hallucinations, this has been an ongoing problem x 3 weeks. Evaluated for this by neurology, not started on medications. Hx memory loss

## 2024-01-29 NOTE — ED Provider Notes (Signed)
 [place] EMERGENCY DEPARTMENT AT [facility] Provider Note   CSN: [id number] Arrival date & time: 01/29/24  1343     History  Chief Complaint  Patient presents with   Hallucinations    Emily Brown is a 78 y.o. female.  78 yo F with a chief complaints of hallucinations.  This been going on for about 3 to 4 weeks now.  They seem to happen mostly at night.  She hears cries that she thinks are her children asking for help.  She started hearing this today as well and so her daughter had called the family doctor who told them to come to the emergency department for evaluation.  She has been coughing a little bit recently.  Denies recent medication change.  Denies recent change to her diet.  Denies chest pain abdominal pain urinary symptoms.        Home Medications Prior to Admission medications   Medication Sig Start Date End Date Taking? Authorizing Provider  hydrochlorothiazide (HYDRODIURIL) 25 MG tablet Take 25 mg by mouth daily. 04/25/16   [provider]  Multiple Vitamins-Minerals (PRESERVISION AREDS PO) Take 1 capsule by mouth daily.     [provider]  Polyethyl Glycol-Propyl Glycol (SYSTANE) 0.4-0.3 % GEL ophthalmic gel Place 1 application into both eyes 2 (two) times daily.    [provider]  polyethylene glycol (MIRALAX ) packet Take 17 g by mouth daily. 12/06/14   Sofia, Leslie K, PA-C  rosuvastatin (CRESTOR) 5 MG tablet Take 5 mg by mouth every other day. 10/14/21   [provider]      Allergies    Amlodipine, Dicyclomine , Ace inhibitors, Amoxicillin, Ceftin [cefuroxime axetil], Penicillins, Rosuvastatin, and Semaglutide    Review of Systems   Review of Systems  Physical Exam Updated Vital Signs BP (!) 146/88 (BP Location: Right Arm)   Pulse 80   Temp 98.1 F (36.7 C) (Oral)   Resp (!) 22   Ht 5\' 6"  (1.676 m)   Wt 84.4 kg   SpO2 97%   BMI 30.02 kg/m  Physical Exam Vitals and nursing note reviewed.   Constitutional:      General: She is not in acute distress.    Appearance: She is well-developed. She is not diaphoretic.  HENT:     Head: Normocephalic and atraumatic.  Eyes:     Pupils: Pupils are equal, round, and reactive to light.  Cardiovascular:     Rate and Rhythm: Normal rate and regular rhythm.     Heart sounds: No murmur heard.    No friction rub. No gallop.  Pulmonary:     Effort: Pulmonary effort is normal.     Breath sounds: No wheezing or rales.  Abdominal:     General: There is no distension.     Palpations: Abdomen is soft.     Tenderness: There is no abdominal tenderness.  Musculoskeletal:        General: No tenderness.     Cervical back: Normal range of motion and neck supple.  Skin:    General: Skin is warm and dry.  Neurological:     Mental Status: She is alert and oriented to person, place, and time.  Psychiatric:        Behavior: Behavior normal.     ED Results / Procedures / Treatments   Labs (all labs ordered are listed, but only abnormal results are displayed) Labs Reviewed  COMPREHENSIVE METABOLIC PANEL WITH GFR - Abnormal; Notable for the following components:  Result Value   Potassium 3.1 (*)    All other components within normal limits  CBC WITH DIFFERENTIAL/PLATELET - Abnormal; Notable for the following components:   MCH 25.9 (*)    All other components within normal limits  URINALYSIS, ROUTINE W REFLEX MICROSCOPIC - Abnormal; Notable for the following components:   APPearance HAZY (*)    Leukocytes,Ua MODERATE (*)    Bacteria, UA MANY (*)    All other components within normal limits    EKG None  Radiology CT Head Wo Contrast Result Date: 01/29/2024 CLINICAL DATA:  Psychosis EXAM: CT HEAD WITHOUT CONTRAST TECHNIQUE: Contiguous axial images were obtained from the base of the skull through the vertex without intravenous contrast. RADIATION DOSE REDUCTION: This exam was performed according to the departmental dose-optimization  program which includes automated exposure control, adjustment of the mA and/or kV according to patient size and/or use of iterative reconstruction technique. COMPARISON:  CT of the head dated July 07, 2015. FINDINGS: Brain: Chronic marked enlargement of the ventricular system, particularly involving the atria and posterior horns of the lateral ventricles bilaterally. The ventricles are similar in size to the previous study and are markedly out of proportion to the cerebral sulci. There is no evidence of hemorrhage, mass or cortical infarction. Vascular: No hyperdense vessel or unexpected calcification. Skull: Normal. Negative for fracture or focal lesion. Sinuses/Orbits: No acute finding. Other: None. IMPRESSION: 1. Chronic enlargement of the ventricular system without significant interval change. No apparent acute process. Electronically Signed   By: Maribeth Shivers M.D.   On: 01/29/2024 16:10   DG Chest Port 1 View Result Date: 01/29/2024 CLINICAL DATA:  Cough. EXAM: PORTABLE CHEST 1 VIEW COMPARISON:  March 31, 2017. FINDINGS: The heart size and mediastinal contours are within normal limits. Both lungs are clear. The visualized skeletal structures are unremarkable. IMPRESSION: No active disease. Electronically Signed   By: Rosalene Colon M.D.   On: 01/29/2024 15:59    Procedures Procedures    Medications Ordered in ED Medications  fosfomycin (MONUROL) packet 3 g (has no administration in time range)    ED Course/ Medical Decision Making/ A&P                                 Medical Decision Making Amount and/or Complexity of Data Reviewed Radiology: ordered.  Risk Prescription drug management.   78 yo F with a chief complaints of auditory hallucinations.  This been going on for about a month now.  She has been seen by her PCP as well as neurology for this.  It does not sound like there was any significant worsening today.  The family had called the PCP who encouraged her to come  here for evaluation.  On my record review the patient was seen fairly recently by neurology.  They felt these hallucinations were unfortunately secondary to her worsening dementia.  Patient is in no distress.  Will obtain a laboratory evaluation.  Repeat CT of the head.  Screen for infection with a chest x-ray and UA.  If these are negative then I think she likely should follow-up with her PCP for further evaluation.  CT head without obvious acute change.  UA with possible UTI though has squamous cells. Give dose of fosfomycin.   No acute anemia.  No significant electrolyte abnormalities.  4:33 PM:  I have discussed the diagnosis/risks/treatment options with the patient and family.  Evaluation and diagnostic testing  in the emergency department does not suggest an emergent condition requiring admission or immediate intervention beyond what has been performed at this time.  They will follow up with PCP. We also discussed returning to the ED immediately if new or worsening sx occur. We discussed the sx which are most concerning (e.g., sudden worsening pain, fever, inability to tolerate by mouth) that necessitate immediate return. Medications administered to the patient during their visit and any new prescriptions provided to the patient are listed below.  Medications given during this visit Medications  fosfomycin (MONUROL) packet 3 g (has no administration in time range)     The patient appears reasonably screen and/or stabilized for discharge and I doubt any other medical condition or other [facility] requiring further screening, evaluation, or treatment in the ED at this time prior to discharge.          Final Clinical Impression(s) / ED Diagnoses Final diagnoses:  Hallucinations    Rx / DC Orders ED Discharge Orders     None         Albertus Hughs, DO 01/29/24 1633

## 2024-01-29 NOTE — ED Provider Triage Note (Signed)
 Emergency Medicine Provider Triage Evaluation Note  Emily Brown , a 78 y.o. female  was evaluated in triage.  Pt complains of auditory hallucinations. Here with her sister whom she lives with. Sister has been aware of patient wandering the house at night for the past few weeks, unsure how long this has really been going on for though. Patient reports hearing her son call for her an night and is looking for him. No falls, fevers, changes in bowel or bladder habits, appetite. Has seen neuro for same.   Review of Systems  Positive:  Negative:   Physical Exam  BP (!) 146/88 (BP Location: Right Arm)   Pulse 80   Temp 98.1 F (36.7 C) (Oral)   Resp (!) 22   Ht 5\' 6"  (1.676 m)   Wt 84.4 kg   SpO2 97%   BMI 30.02 kg/m  Gen:   Awake, no distress   Resp:  Normal effort  MSK:   Moves extremities without difficulty  Other:    Medical Decision Making  Medically screening exam initiated at 3:01 PM.  Appropriate orders placed.  Emily Brown was informed that the remainder of the evaluation will be completed by another provider, this initial triage assessment does not replace that evaluation, and the importance of remaining in the ED until their evaluation is complete.     Darlis Eisenmenger, PA-C 01/29/24 1502

## 2024-05-05 ENCOUNTER — Ambulatory Visit (INDEPENDENT_AMBULATORY_CARE_PROVIDER_SITE_OTHER): Admitting: Podiatry

## 2024-05-05 ENCOUNTER — Encounter: Payer: Self-pay | Admitting: Podiatry

## 2024-05-05 DIAGNOSIS — M79675 Pain in left toe(s): Secondary | ICD-10-CM | POA: Diagnosis not present

## 2024-05-05 DIAGNOSIS — B351 Tinea unguium: Secondary | ICD-10-CM

## 2024-05-05 DIAGNOSIS — F0390 Unspecified dementia without behavioral disturbance: Secondary | ICD-10-CM

## 2024-05-05 DIAGNOSIS — M79674 Pain in right toe(s): Secondary | ICD-10-CM | POA: Diagnosis not present

## 2024-05-05 NOTE — Progress Notes (Addendum)
 This patient presents to the office with chief complaint of long thick painful nails.  Patient says the nails are painful walking and wearing shoes.  This patient is unable to self treat.  This patient is unable to trim her nails since she is unable to reach her nails.  She presents to the office for preventative foot care services.  General Appearance  Alert, conversant and in no acute stress.  Vascular  Dorsalis pedis and posterior tibial  pulses are palpable  bilaterally.  Capillary return is within normal limits  bilaterally. Temperature is within normal limits  bilaterally.  Neurologic  Senn-Weinstein monofilament wire test within normal limits  bilaterally. Muscle power within normal limits bilaterally.  Nails Thick disfigured discolored nails with subungual debris  from hallux to fifth toes bilaterally. No evidence of bacterial infection or drainage bilaterally.  Left hallux nail is loosely attached to nail bed.  Orthopedic  No limitations of motion  feet .  No crepitus or effusions noted.  No bony pathology or digital deformities noted. HAV  B/L.  Skin  normotropic skin with no porokeratosis noted bilaterally.  No signs of infections or ulcers noted.     Onychomycosis  Nails  B/L.  Pain in right toes  Pain in left toes  Debridement of nails both feet followed trimming the nails with dremel tool.    RTC 4  months.   Cordella Bold DPM  tomma

## 2024-06-02 ENCOUNTER — Telehealth: Payer: Self-pay | Admitting: Neurology

## 2024-06-02 NOTE — Telephone Encounter (Signed)
 Pt's sister(on DPR) has called to report that she stays very closely to pt (4-5 houses away) while in her garden she looked to find pt in her house.  Pt thought she was at work. Pt has an increase of poor memory, hearing voices, seeing things, and now breaking down crying, pt's sister is asking to be called(she declined setting up a my chart at this time )

## 2024-06-02 NOTE — Telephone Encounter (Signed)
 Call to sister who reports increased auditory and visual hallucinations of her children being trapped outside. It is causing her distress. Advised I will discuss with Dr. Gregg in Dr. Onita absence. Had previously used Seroquel but made patient to sleepy.   Discussed with Dr. Gregg, and he advised to give half a tablet (12.5 mg) seroquel in the morning and at night and see if that helps. If this doesn't help then could discuss other antipsychotics. Sister verbalized understanding and in agreement to try seroquel 12.5 mg at bedtime and in the morning.

## 2024-06-22 ENCOUNTER — Other Ambulatory Visit: Payer: Self-pay

## 2024-06-22 ENCOUNTER — Encounter (HOSPITAL_COMMUNITY): Payer: Self-pay

## 2024-06-22 ENCOUNTER — Emergency Department (HOSPITAL_COMMUNITY)

## 2024-06-22 ENCOUNTER — Emergency Department (HOSPITAL_COMMUNITY)
Admission: EM | Admit: 2024-06-22 | Discharge: 2024-06-22 | Disposition: A | Discharge status: Home / Self Care | Attending: Emergency Medicine | Admitting: Emergency Medicine

## 2024-06-22 DIAGNOSIS — W1830XA Fall on same level, unspecified, initial encounter: Secondary | ICD-10-CM | POA: Insufficient documentation

## 2024-06-22 DIAGNOSIS — I10 Essential (primary) hypertension: Secondary | ICD-10-CM | POA: Diagnosis not present

## 2024-06-22 DIAGNOSIS — S63502A Unspecified sprain of left wrist, initial encounter: Secondary | ICD-10-CM | POA: Diagnosis not present

## 2024-06-22 DIAGNOSIS — Y92012 Bathroom of single-family (private) house as the place of occurrence of the external cause: Secondary | ICD-10-CM | POA: Diagnosis not present

## 2024-06-22 DIAGNOSIS — M25532 Pain in left wrist: Secondary | ICD-10-CM | POA: Diagnosis present

## 2024-06-22 DIAGNOSIS — S8002XA Contusion of left knee, initial encounter: Secondary | ICD-10-CM | POA: Diagnosis not present

## 2024-06-22 DIAGNOSIS — W19XXXA Unspecified fall, initial encounter: Secondary | ICD-10-CM

## 2024-06-22 MED ORDER — ACETAMINOPHEN 325 MG PO TABS
975.0000 mg | ORAL_TABLET | Freq: Once | ORAL | Status: AC
  Administered 2024-06-22: 975 mg via ORAL
  Filled 2024-06-22: qty 3

## 2024-06-22 NOTE — Discharge Instructions (Signed)
 You were seen in the emergency room for injuries after a fall The x-ray of your left knee and left wrist looked okay but you may have sprained the left wrist For this reason we gave you a brace to go home with You can leave this in place for comfort and extra support but be sure to stretch the left wrist a few times a day and take the brace off Take Tylenol  Motrin as directed for pain Follow-up with your primary doctor in 1 week for reevaluation Return to the emergency department for repeated falls You can also follow-up with orthopedic surgery if your left wrist pain is getting worse

## 2024-06-22 NOTE — ED Provider Notes (Signed)
 [place] EMERGENCY DEPARTMENT AT [facility] Provider Note   CSN: [id number] Arrival date & time: 06/22/24  1412     Patient presents with: Emily Brown is a 78 y.o. female.  With a history of hypertension dementia who presents to the ED after a fall.  Patient sustained a mechanical fall in the bathroom at home earlier this morning.  No head trauma or loss consciousness.  She was able to ambulate and attend Sunday service and Sunday school but began to report left wrist pain and left knee pain to her sister.  Denies headaches neck pain back pain chest pain shortness of breath abdominal pain pain in other extremities.  No anticoagulation    Fall       Prior to Admission medications   Medication Sig Start Date End Date Taking? Authorizing Provider  hydrochlorothiazide (HYDRODIURIL) 25 MG tablet Take 25 mg by mouth daily. 04/25/16   [provider]  Multiple Vitamins-Minerals (PRESERVISION AREDS PO) Take 1 capsule by mouth daily.     [provider]  Polyethyl Glycol-Propyl Glycol (SYSTANE) 0.4-0.3 % GEL ophthalmic gel Place 1 application into both eyes 2 (two) times daily.    [provider]  polyethylene glycol (MIRALAX ) packet Take 17 g by mouth daily. 12/06/14   Sofia, Leslie K, PA-C  QUEtiapine (SEROQUEL) 25 MG tablet Take 12.5 mg by mouth 2 (two) times daily.    [provider]  rosuvastatin (CRESTOR) 5 MG tablet Take 5 mg by mouth every other day. 10/14/21   [provider]    Allergies: Amlodipine, Dicyclomine , Ace inhibitors, Amoxicillin, Ceftin [cefuroxime axetil], Penicillins, Rosuvastatin, and Semaglutide    Review of Systems  Updated Vital Signs BP 132/87   Pulse 86   Temp 97.9 F (36.6 C) (Oral)   Resp 16   Ht 5' 6 (1.676 m)   Wt 84.4 kg   SpO2 98%   BMI 30.02 kg/m   Physical Exam Vitals and nursing note reviewed.  HENT:     Head: Normocephalic and atraumatic.  Eyes:     Pupils: Pupils are  equal, round, and reactive to light.  Cardiovascular:     Rate and Rhythm: Normal rate and regular rhythm.  Pulmonary:     Effort: Pulmonary effort is normal.     Breath sounds: Normal breath sounds.  Abdominal:     Palpations: Abdomen is soft.     Tenderness: There is no abdominal tenderness.  Musculoskeletal:     Cervical back: Neck supple. No tenderness.     Comments: Tenderness over left distal wrist no deformity Full active range of motion all 4 extremities Sensation tact light touch throughout No midline tenderness step-off deformity back No significant tenderness or effusion over left knee  Skin:    General: Skin is warm and dry.  Neurological:     General: No focal deficit present.     Mental Status: She is alert.     Sensory: No sensory deficit.     Motor: No weakness.  Psychiatric:        Mood and Affect: Mood normal.     (all labs ordered are listed, but only abnormal results are displayed) Labs Reviewed - No data to display  EKG: None  Radiology: DG Knee Complete 4 Views Left Result Date: 06/22/2024 EXAM: 4 OR MORE VIEW(S) XRAY OF THE LEFT KNEE 06/22/2024 03:19:00 PM COMPARISON: None available. CLINICAL HISTORY: Fall. FINDINGS: BONES AND JOINTS: No acute fracture. No focal osseous  lesion. No joint dislocation. No significant joint effusion. No significant degenerative changes. SOFT TISSUES: The soft tissues are unremarkable. IMPRESSION: 1. No significant abnormality. Electronically signed by: Lynwood Seip MD 06/22/2024 03:33 PM EST RP Workstation: [id number]   DG Wrist Complete Left Result Date: 06/22/2024 EXAM: 3 OR MORE VIEW(S) XRAY OF THE LEFT WRIST 06/22/2024 03:19:00 PM COMPARISON: None available. CLINICAL HISTORY: Fall. FINDINGS: BONES AND JOINTS: No acute fracture. No focal osseous lesion. No joint dislocation. SOFT TISSUES: The soft tissues are unremarkable. IMPRESSION: 1. No significant abnormality. Electronically signed by: Lynwood Seip MD 06/22/2024 03:32  PM EST RP Workstation: [id number]     Procedures   Medications Ordered in the ED  acetaminophen  (TYLENOL ) tablet 975 mg (has no administration in time range)                                    Medical Decision Making 78 year old female with history as above presented to the ED for injuries after fall in the bathroom this morning.  Mechanical in nature.  Left knee without evidence of tenderness trauma.  X-ray negative.  X-ray of the left wrist also negative but she is having some tenderness along the distal radius.  Most consistent with sprain of the left wrist.  Will discharge with left wrist brace instruction for symptomatic management with Tylenol  Motrin at home and PCP/Ortho follow-up if needed.  No other head trauma or other injuries that would warrant imaging at this time  Risk OTC drugs.        Final diagnoses:  Fall in home, initial encounter  Contusion of left knee, initial encounter  Sprain of left wrist, unspecified location, initial encounter    ED Discharge Orders     None          Pamella Ozell LABOR, DO 06/22/24 1558

## 2024-06-22 NOTE — ED Triage Notes (Signed)
 Patient fell this morning. Denies hitting head. She reports having left wrist pain, and left knee pain. Doe not take blood thinners.

## 2024-06-22 NOTE — ED Provider Triage Note (Signed)
 Emergency Medicine Provider Triage Evaluation Note  Almas Rake Zackery , a 78 y.o. female  was evaluated in triage.  Pt complains of left in the shower no dizziness or seizure-like activity for this.  Complains of left wrist and left knee pain.  Can walk with steady gait  Review of Systems  Positive: Left wrist pain Negative: HA, Neck pain  Physical Exam  BP (!) 117/57   Pulse 79   Temp 97.9 F (36.6 C)   Resp 15   Ht 5' 6 (1.676 m)   Wt 84.4 kg   SpO2 97%   BMI 30.02 kg/m  Gen:   Awake, no distress   Resp:  Normal effort  MSK:   Moves extremities without difficulty  Other:    Medical Decision Making  Medically screening exam initiated at 2:50 PM.  Appropriate orders placed.  Sherley Mckenney Michon was informed that the remainder of the evaluation will be completed by another provider, this initial triage assessment does not replace that evaluation, and the importance of remaining in the ED until their evaluation is complete.     Shermon Warren SAILOR, PA-C 06/22/24 1450

## 2024-07-03 ENCOUNTER — Telehealth: Payer: Self-pay | Admitting: Neurology

## 2024-07-03 NOTE — Telephone Encounter (Signed)
 Call to sister who reports patient is still hearing voices and now hearing them throughout the day. Currently taking 12.5 mg seroquel twice daily. Sister is asking if anything can be done to help with voices and hallucinations.advised would send to Dr. Onita to review

## 2024-07-03 NOTE — Telephone Encounter (Signed)
 Pt's sister is asking for a call to discuss that there is an increase in pt hearing voices, the current dose of QUEtiapine (SEROQUEL) 25 MG tablet is not helping, please call.

## 2024-07-04 NOTE — Telephone Encounter (Signed)
 Please advice patient and sister, she may take higher dose of seroquel 25mg  every night.

## 2024-07-07 MED ORDER — QUETIAPINE FUMARATE 25 MG PO TABS: ORAL_TABLET | ORAL | 3 refills | Status: AC

## 2024-07-07 NOTE — Telephone Encounter (Signed)
 Discussed and reviewed medication dose changes with sister. She verbalized understanding.

## 2024-07-07 NOTE — Telephone Encounter (Signed)
 Call to sister, no answer. Advised to call back. Dr. Onita advises to increase seroquel to 25 mg nightly. Keep current daytime dose to 12.5 mg.

## 2024-07-07 NOTE — Addendum Note (Signed)
 Addended by: JOSHUA IZETTA CROME on: 07/07/2024 11:23 AM   Modules accepted: Orders

## 2024-07-07 NOTE — Telephone Encounter (Addendum)
 Pt's sister has returned call to April, RN.

## 2024-08-04 ENCOUNTER — Encounter: Payer: Self-pay | Admitting: Podiatry

## 2024-08-04 ENCOUNTER — Ambulatory Visit: Admitting: Podiatry

## 2024-08-04 DIAGNOSIS — M79674 Pain in right toe(s): Secondary | ICD-10-CM

## 2024-08-04 DIAGNOSIS — M79675 Pain in left toe(s): Secondary | ICD-10-CM

## 2024-08-04 DIAGNOSIS — B351 Tinea unguium: Secondary | ICD-10-CM | POA: Diagnosis not present

## 2024-08-04 DIAGNOSIS — F0390 Unspecified dementia without behavioral disturbance: Secondary | ICD-10-CM | POA: Diagnosis not present

## 2024-08-04 NOTE — Progress Notes (Signed)
 This patient presents to the office with chief complaint of long thick painful nails.  Patient says the nails are painful walking and wearing shoes.  This patient is unable to self treat.  This patient is unable to trim her nails since she is unable to reach her nails.  She presents to the office for preventative foot care services.  General Appearance  Alert, conversant and in no acute stress.  Vascular  Dorsalis pedis and posterior tibial  pulses are palpable  bilaterally.  Capillary return is within normal limits  bilaterally. Temperature is within normal limits  bilaterally.  Neurologic  Senn-Weinstein monofilament wire test within normal limits  bilaterally. Muscle power within normal limits bilaterally.  Nails Thick disfigured discolored nails with subungual debris  from hallux to fifth toes bilaterally. No evidence of bacterial infection or drainage bilaterally.  Left hallux nail is loosely attached to nail bed.  Orthopedic  No limitations of motion  feet .  No crepitus or effusions noted.  No bony pathology or digital deformities noted. HAV  B/L.  Skin  normotropic skin with no porokeratosis noted bilaterally.  No signs of infections or ulcers noted.     Onychomycosis  Nails  B/L.  Pain in right toes  Pain in left toes  Debridement of nails both feet followed trimming the nails with dremel tool.    RTC 4  months.   Cordella Bold DPM  tomma

## 2024-12-03 ENCOUNTER — Ambulatory Visit: Admitting: Podiatry
